# Patient Record
Sex: Male | Born: 1970 | Hispanic: Yes | Marital: Married | Smoking: Never smoker
Health system: Southern US, Community
[De-identification: ages and names within clinical notes are randomized; demographics above are authoritative.]

## PROBLEM LIST (undated history)

## (undated) DIAGNOSIS — M5440 Lumbago with sciatica, unspecified side: Secondary | ICD-10-CM

## (undated) DIAGNOSIS — F1190 Opioid use, unspecified, uncomplicated: Secondary | ICD-10-CM

## (undated) HISTORY — DX: Opioid use, unspecified, uncomplicated: F11.90

## (undated) HISTORY — DX: Lumbago with sciatica, unspecified side: M54.40

---

## 2012-05-06 ENCOUNTER — Emergency Department: Payer: Self-pay | Admitting: Emergency Medicine

## 2012-05-13 ENCOUNTER — Emergency Department: Payer: Self-pay | Admitting: Emergency Medicine

## 2012-06-14 ENCOUNTER — Encounter: Payer: Self-pay | Admitting: Orthopedic Surgery

## 2012-07-09 ENCOUNTER — Ambulatory Visit: Payer: Self-pay | Admitting: Orthopedic Surgery

## 2012-07-15 ENCOUNTER — Encounter: Payer: Self-pay | Admitting: Orthopedic Surgery

## 2014-01-20 ENCOUNTER — Emergency Department: Payer: Self-pay | Admitting: Emergency Medicine

## 2014-01-29 ENCOUNTER — Emergency Department: Payer: Self-pay | Admitting: Emergency Medicine

## 2014-03-07 IMAGING — CR DG KNEE COMPLETE 4+V*L*
1 series · 4 of 4 positions shown · non-contrast
Comparison: none

REASON FOR EXAM: left knee pain with ambulation
COMMENTS:

PROCEDURE:     DXR - DXR KNEE LT COMP WITH OBLIQUES  - May 13, 2012  [DATE]
RESULT:     There is no evidence of fracture, dislocation, or malalignment.

[Series 1: ap · 0.17mm/px · 4 of 4 slices shown]
[im 1/4]
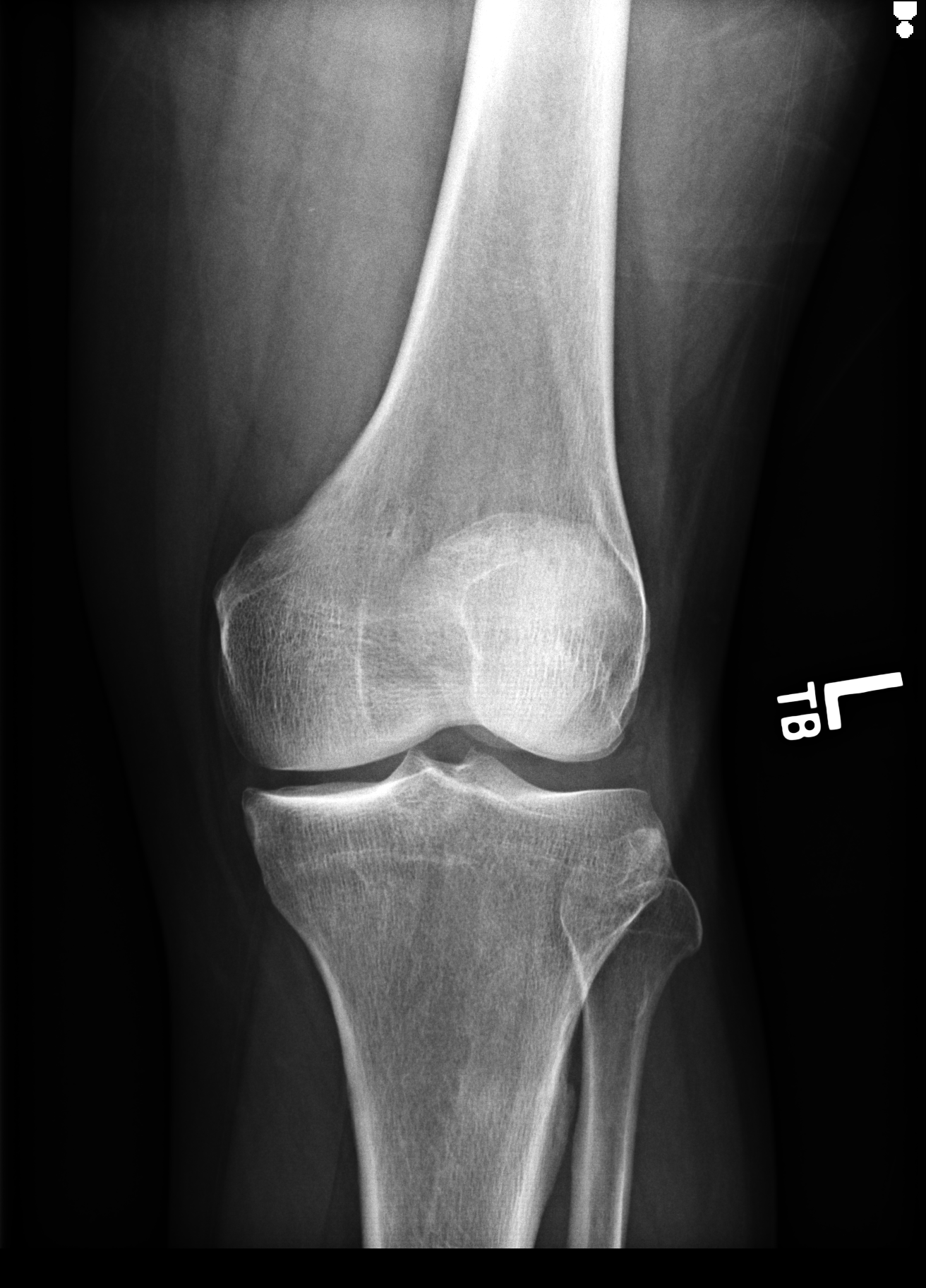
[im 2/4]
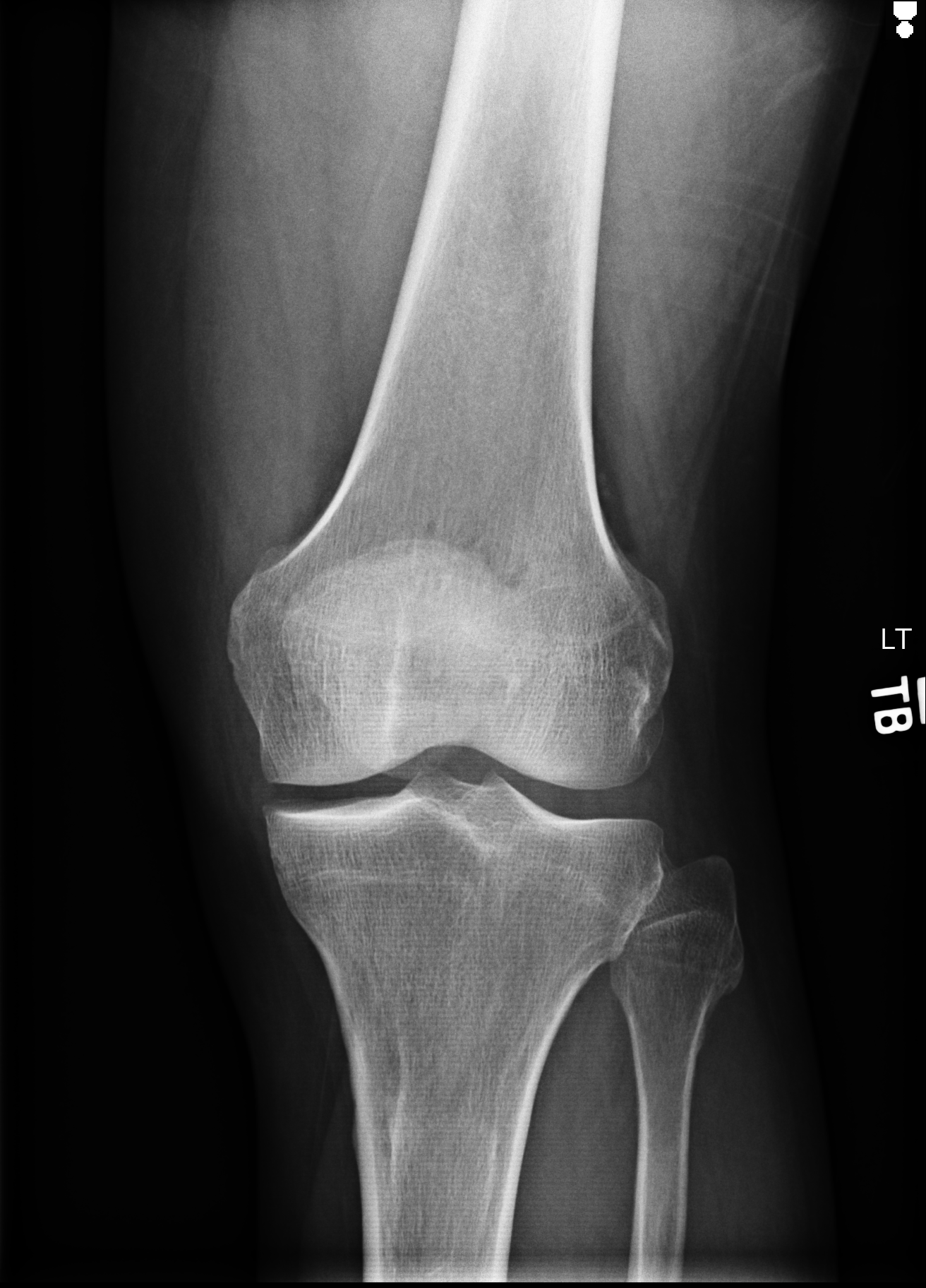
[im 3/4]
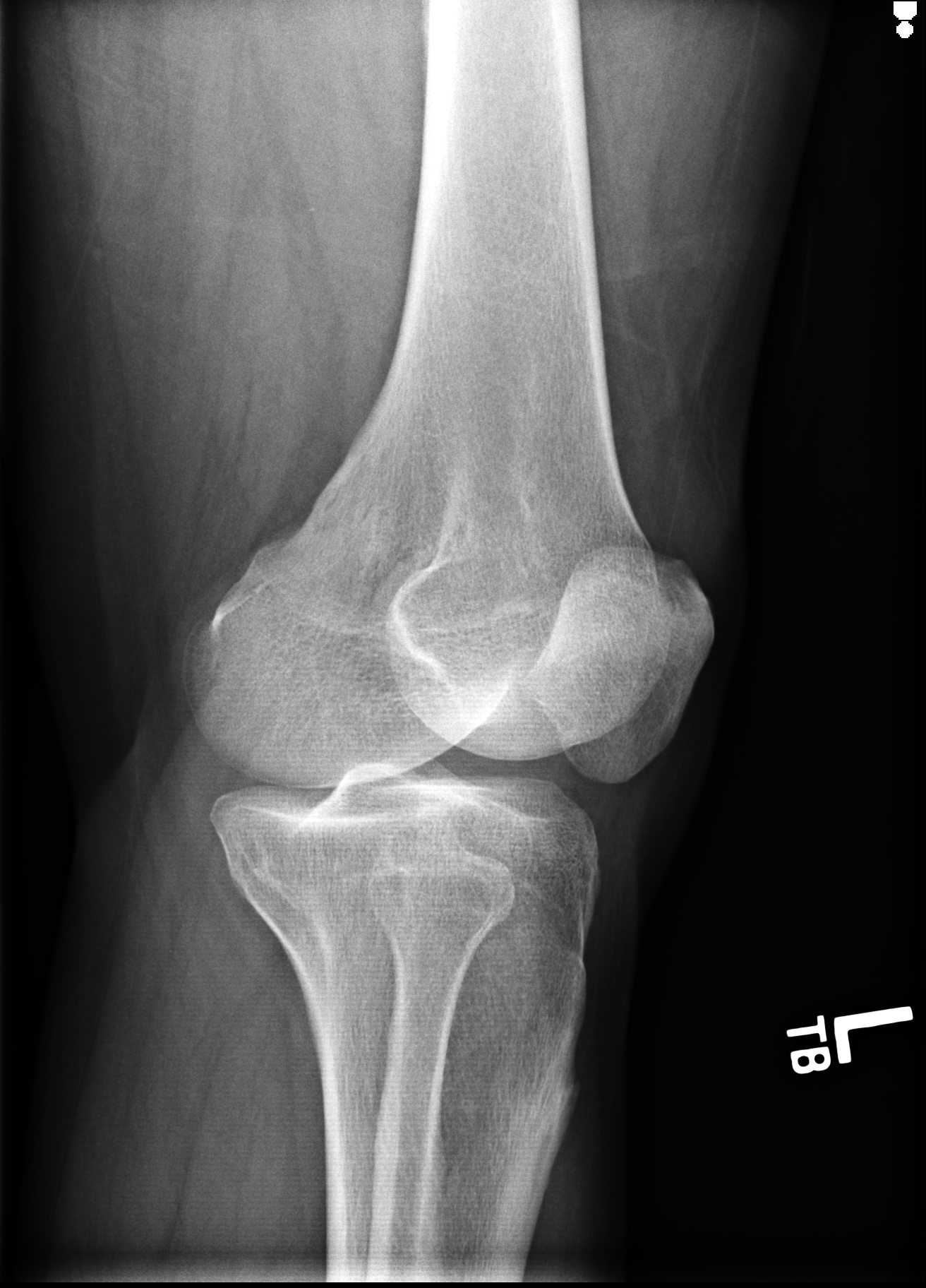
[im 4/4]
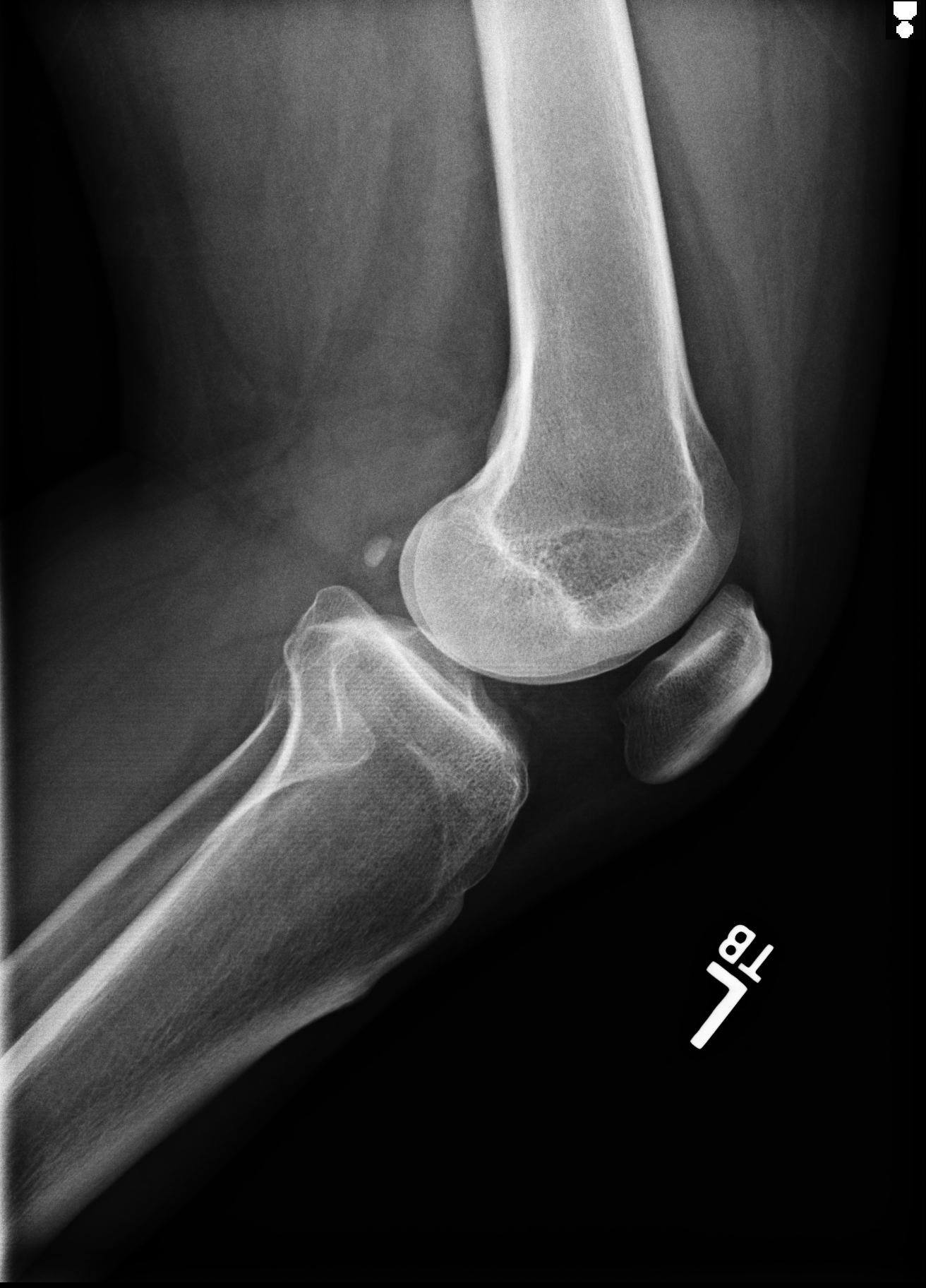

[4 of 4 positions shown; findings below may reference images not displayed]

IMPRESSION: 1. No evidence of acute abnormalities.
2. If there are persistent complaints of pain or persistent clinical
concern, a repeat evaluation in 7-10 days is recommended if clinically
warranted.

## 2014-10-15 ENCOUNTER — Emergency Department: Payer: Self-pay | Admitting: Emergency Medicine

## 2014-10-15 LAB — URINALYSIS, COMPLETE
BACTERIA: NONE SEEN
Bilirubin,UR: NEGATIVE
Blood: NEGATIVE
Glucose,UR: >=500 mg/dL (ref 0–75)
Leukocyte Esterase: NEGATIVE
Nitrite: NEGATIVE
Ph: 5 (ref 4.5–8.0)
Protein: NEGATIVE
SPECIFIC GRAVITY: 1.038 (ref 1.003–1.030)
Squamous Epithelial: NONE SEEN
WBC UR: <1 /HPF (ref 0–5)

## 2015-07-18 ENCOUNTER — Ambulatory Visit: Payer: Self-pay | Attending: Physical Medicine and Rehabilitation | Admitting: Physical Therapy

## 2015-07-18 DIAGNOSIS — M5442 Lumbago with sciatica, left side: Secondary | ICD-10-CM | POA: Insufficient documentation

## 2015-07-18 DIAGNOSIS — M5432 Sciatica, left side: Secondary | ICD-10-CM | POA: Insufficient documentation

## 2015-07-18 DIAGNOSIS — G8929 Other chronic pain: Secondary | ICD-10-CM | POA: Insufficient documentation

## 2015-07-18 NOTE — Patient Instructions (Signed)
All exercises provided were adapted from [email or web address]. Patient was provided a written handout with pictures as described. Any additional cues were manually entered in to handout and copied in to this document. (All exercises x 10 repetitions x 3 times per day)   HAMSTRING STRETCH - SUPINE  Mientras est acostado boca arriba, levante la pierna y sostenga la parte posterior de la rodilla hasta que se sienta un estiramiento.  Estiramiento Piriformis (Piriformis stretch)  Flexione la cadera hacia el cuerpo y tire del cuerpo hacia el lado opuesto.  Hilo dental del nervio citico (sciatic nerve flossing)  -Sit con las manos detrs de la espalda Puntear hacia abajo, hacia abajo Puntear los dedos hacia arriba -alternacin entre posiciones

## 2015-07-18 NOTE — Therapy (Signed)
[facility] MEDICINE [address] [place], [place], [address] Phone: [phone]   Fax:  [phone]  Physical Therapy Evaluation  Patient Details  Name: Steven Luna MRN: [id number] Date of Birth: 12/28/1970 No Data Recorded  Encounter Date: 07/18/2015      PT End of Session - 07/18/15 0956    Visit Number 1   Number of Visits 9   Date for PT Re-Evaluation 08/22/15   PT Start Time 0845   PT Stop Time 0945   PT Time Calculation (min) 60 min   Activity Tolerance Patient tolerated treatment well   Behavior During Therapy [facility] for tasks assessed/performed      No past medical history on file.  No past surgical history on file.  There were no vitals filed for this visit.  Visit Diagnosis:  Chronic left-sided low back pain with left-sided sciatica - Plan: PT plan of care cert/re-cert  Sciatica associated with disorder of lumbar spine, left - Plan: PT plan of care cert/re-cert      Subjective Assessment - 07/18/15 1444    Subjective Patient reports he began developing lower back pain that radiates down his L leg to his calf roughly February of 2015. He has not worked since then as a result of pain. Patient reports that he now has difficulty sleeping and performing ADLs as a result of these symptoms. Patient reports that sitting, standing in the shower, and ambulating have increased his symptoms.    Pertinent History Patient has had a similar experience several years ago, sought PT services and found symptoms resolved for 1.5 years.    Limitations Sitting;Lifting;Standing;Walking;House hold activities   Diagnostic tests Nonenoted.    Patient Stated Goals To reduce pain levels, similar to previous PT encounter.    Currently in Pain? Yes   Pain Score 7    Pain Location Back   Pain Orientation Left   Pain Type Chronic pain   Pain Onset More than a month ago   Pain Frequency Intermittent   Aggravating Factors  Moving  spine (flex/ext), physical activity, standing or sitting for any length of time.    Pain Relieving Factors Laying down   Effect of Pain on Daily Activities Is unable to work due to pain.             [facility] Assessment - 07/18/15 1015    Assessment   Medical Diagnosis --  Lumbar Radiculiitis   Prior Therapy --  Successful PT several years ago   Precautions   Precautions None   Restrictions   Weight Bearing Restrictions No   Balance Screen   Has the patient fallen in the past 6 months No   Has the patient had a decrease in activity level because of a fear of falling?  Yes   Home [employer]   Living Environment Private residence   Prior Function   Level of Independence Independent   Vocation --  Unemployed currently   Cognition   Overall Cognitive Status Within Functional Limits for tasks assessed   Observation/Other Assessments   Modified [place] 84   Fear Avoidance Belief Questionnaire (FABQ)  45   Sensation   Light Touch Not tested   Posture/Postural Control   Posture/Postural Control Postural limitations   Postural Limitations Weight shift right   Posture Comments --  Patient noted to be WB through toes only on L. R shoulder el   ROM / Strength   AROM / PROM / Strength Strength  Strength   Right Hip Flexion 5/5   Right Hip Extension 4+/5   Right Hip ABduction 4+/5   Left Hip Flexion 4+/5   Left Hip Extension 4+/5   Left Hip ABduction 4/5   Right Knee Flexion 5/5   Right Knee Extension 5/5   Left Knee Flexion 5/5   Left Knee Extension 4+/5   Right Ankle Dorsiflexion 5/5   Right Ankle Plantar Flexion 5/5   Right Ankle Inversion 5/5   Right Ankle Eversion 5/5   Left Ankle Dorsiflexion 5/5   Left Ankle Plantar Flexion 5/5   Left Ankle Inversion 5/5   Left Ankle Eversion 5/5   FABER test   findings Negative   Slump test   Findings Positive   Side Left   Comment --  Improved with nerve flossing   Straight Leg Raise   Findings Positive   Side  Left    Criag's Test    Findings --  Restricted IR/ER no increase in pain though   Ely's Test   Findings Positive   Side Left   Ambulation/Gait   Gait Pattern --  R weight shifting noted     90-90 testing revealed tightness and reproduction of symptoms on LLE   Piriformis stretching 2 sets x 2 minutes on LLE, stated this provided relief.   Nerve flossing on LLE, significantly reduced symptoms into his leg and patient described centralization of symptoms after. 2 sets x 12 repetitions with manual overpressure.   Bridging - significantly increased symptoms on 1st attempt   TFL substitution noted in hip abductor MMT.   Grade I-II mobilizations provided to PSIS on L side, states this relieved his pain/symptoms.   Long axis distraction bilaterally x 2 minutes, states this relieved symptoms   Patient left clinic stating centralized symptoms and his understanding of need to continue with exercises provided during this session.                       PT Education - 07/18/15 0954    Education provided Yes   Person(s) Educated Patient   Methods Explanation;Demonstration;Verbal cues;Handout;Tactile cues   Comprehension Verbalized understanding;Returned demonstration             PT Long Term Goals - 07/18/15 1450    PT LONG TERM GOAL #1   Title Patient will report VAS score of worst pain as less than 5/10 to demonstrate increased tolerance for ADLs.    Baseline 8/10   Time 4   Period Weeks   Status New   PT LONG TERM GOAL #2   Title Patient will report an ODI score of less than 70% disability to demonstrate increased tolerance for ADLs.    Baseline 84%   Time 4   Period Weeks   Status New   PT LONG TERM GOAL #3   Title Patient will report no increase in pain with turning in bed to allow for more comfortable sleep.   Time 4   Period Weeks   Status New   PT LONG TERM GOAL #4   Title Patient will be independent with a HEP to increase his tolerance for ADLs and  decrease his symptoms.    Time 4   Period Weeks               Plan - 07/18/15 1610    Clinical Impression Statement Patient is a 44 y/o with recurrent lower back  pain and left sided sciatica. Patient has previously had  this resolve x 1.5 years, however he has had this current bout x 2 years. Today he presents with numbness and tingling most pronounced around the lateral distal femur, which begins to centralize with piriformis stretching, nerve flossing, and UPAs over L PSIS. Pain reproduced over L PSIS, though UPAs. Gait demonstrated R sided weight shifting, both flexion and extension increased symptoms on this visit. Patient presentation seems consistent with laterally displaced disc material leading to sciatic nerve irritation. Patient responded well to treatment today, and has a previous history of successful bout of PT for this presentation.    Pt will benefit from skilled therapeutic intervention in order to improve on the following deficits Abnormal gait;Pain;Decreased mobility;Decreased strength;Impaired flexibility;Difficulty walking   Rehab Potential Good   Clinical Impairments Affecting Rehab Potential Pro- quick response, previous successful response, centralization in this session. Cons - long standing chronic pain. High FAB-Q score.    PT Frequency 2x / week   PT Duration 2 weeks  Then 1x per week, patient is self pay   PT Treatment/Interventions Traction;Moist Heat;Iontophoresis /ml Dexamethasone;Electrical Stimulation;Gait training;Biofeedback;Patient/family education;Manual techniques;Therapeutic exercise;Therapeutic activities;Dry needling;Taping   PT Next Visit Plan Progress HEP and manual stretching techniques. Check leg length. Weight shifting corrections.    PT Home Exercise Plan See patient instructions   Consulted and Agree with Plan of Care Patient         Problem List There are no active problems to display for this patient.  Kerin Ransom, PT, DPT     07/18/2015, 3:41 PM  [place] [facility] PHYSICAL AND SPORTS MEDICINE [address], [place], [address] Phone: [phone]   Fax:  [phone]  Name: Steven Luna MRN: [id number] Date of Birth: 06-17-1971

## 2015-07-22 ENCOUNTER — Ambulatory Visit: Payer: Self-pay | Admitting: Physical Therapy

## 2015-07-22 DIAGNOSIS — M5432 Sciatica, left side: Secondary | ICD-10-CM

## 2015-07-22 NOTE — Therapy (Signed)
[place] [facility]YSICAL [facility]ORTS MEDICINE [address] [place], [place], [address] Phone: [phone]   Fax:  [phone]  Physical Therapy Treatment  Patient Details  Name: Steven Luna MRN: [id number] Date of Birth: 01/09/1971 No Data Recorded  Encounter Date: 07/22/2015      PT End of Session - 07/22/15 0953    Visit Number 2   Number of Visits 9   Date for PT Re-Evaluation 08/22/15   PT Start Time 0945   PT Stop Time 1023   PT Time Calculation (min) 38 min   Activity Tolerance Patient tolerated treatment well   Behavior During Therapy [facility] for tasks assessed/performed      No past medical history on file.  No past surgical history on file.  There were no vitals filed for this visit.  Visit Diagnosis:  Sciatica associated with disorder of lumbar spine, left      Subjective Assessment - 07/22/15 0950    Subjective Pt reports he is doing better. He has been doing his stretches.   Pertinent History Patient has had a similar experience several years ago, sought PT services and found symptoms resolved for 1.5 years.    Limitations Sitting;Lifting;Standing;Walking;House hold activities   Diagnostic tests Nonenoted.    Patient Stated Goals To reduce pain levels, similar to previous PT encounter.    Currently in Pain? Yes   Pain Score 5    Pain Location Back   Pain Onset More than a month ago        Objective: Reviewed stretches and had pt perform exercises with no corrections needed.  Supine manual traction 5x1 min - reduction in pain to 2/10.  Press up in prone focusing on addressing extension - pt reported incr. Symptoms. Added manual overpressure at L1 which reduced syptoms to 0/10. Performed 3x10 of this.  Manual piriformis stretch 3x1 min on L.  Neural glide performed - pt reported incr. Pain. Upon standing noted significant lateral sihft.  Focus of rest of session on resolving this with manual shift  correction.  Performed manual shift correction - added extension 3x10. Pt reported pain centralized with this so issued self manual shift correction to be performed at home. Translator present for session.                         PT Education - 07/22/15 0953    Education provided Yes   Education Details HEP, progression of PT   Person(s) Educated Patient   Methods Explanation;Demonstration   Comprehension Verbalized understanding             PT Long Term Goals - 07/18/15 1450    PT LONG TERM GOAL #1   Title Patient will report VAS score of worst pain as less than 5/10 to demonstrate increased tolerance for ADLs.    Baseline 8/10   Time 4   Period Weeks   Status New   PT LONG TERM GOAL #2   Title Patient will report an ODI score of less than 70% disability to demonstrate increased tolerance for ADLs.    Baseline 84%   Time 4   Period Weeks   Status New   PT LONG TERM GOAL #3   Title Patient will report no increase in pain with turning in bed to allow for more comfortable sleep.   Time 4   Period Weeks   Status New   PT LONG TERM GOAL #4   Title Patient  will be independent with a HEP to increase his tolerance for ADLs and decrease his symptoms.    Time 4   Period Weeks               Plan - 07/22/15 1030    Clinical Impression Statement Pt appears to have primary problem of lateral shift. Would benefit from change in focus from slump/nerve flossing to addressing extension preference/lateral shift correction. Pt would also benefit from further traction to address pain.   Pt will benefit from skilled therapeutic intervention in order to improve on the following deficits Abnormal gait;Pain;Decreased mobility;Decreased strength;Impaired flexibility;Difficulty walking   Rehab Potential Good   Clinical Impairments Affecting Rehab Potential Pro- quick response, previous successful response, centralization in this session. Cons - long standing chronic  pain. High FAB-Q score.    PT Frequency 2x / week   PT Duration 2 weeks  Then 1x per week, patient is self pay   PT Treatment/Interventions Traction;Moist Heat;Iontophoresis /ml Dexamethasone;Electrical Stimulation;Gait training;Biofeedback;Patient/family education;Manual techniques;Therapeutic exercise;Therapeutic activities;Dry needling;Taping   PT Next Visit Plan Progress HEP and manual stretching techniques. Check leg length. Weight shifting corrections.    PT Home Exercise Plan See patient instructions   Consulted and Agree with Plan of Care Patient        Problem List There are no active problems to display for this patient.   Fisher,Benjamin PT 07/22/2015, 11:49 AM  [place] [facility] PHYSICAL AND SPORTS MEDICINE [address], [place], [address] Phone: [phone]   Fax:  [phone]  Name: Steven Luna MRN: [id number] Date of Birth: 07-30-1971

## 2015-07-25 ENCOUNTER — Ambulatory Visit: Payer: Self-pay | Admitting: Physical Therapy

## 2015-07-25 DIAGNOSIS — M5442 Lumbago with sciatica, left side: Secondary | ICD-10-CM

## 2015-07-25 DIAGNOSIS — M5432 Sciatica, left side: Secondary | ICD-10-CM

## 2015-07-25 DIAGNOSIS — G8929 Other chronic pain: Secondary | ICD-10-CM

## 2015-07-25 NOTE — Therapy (Signed)
[place] [facility]TS [facility] [address], [place], [address] Phone: [phone]   Fax:  [phone]  Physical Therapy Treatment  Patient Details  Name: Steven Luna MRN: [id number] Date of Birth: Jan 16, 1971 No Data Recorded  Encounter Date: 07/25/2015      PT End of Session - 07/25/15 1343    Visit Number 3   Number of Visits 9   Date for PT Re-Evaluation 08/22/15   PT Start Time 1300   PT Stop Time 1340   PT Time Calculation (min) 40 min   Activity Tolerance Patient tolerated treatment well;No increased pain   Behavior During Therapy [facility] for tasks assessed/performed      No past medical history on file.  No past surgical history on file.  There were no vitals filed for this visit.  Visit Diagnosis:  Sciatica associated with disorder of lumbar spine, left  Chronic left-sided low back pain with left-sided sciatica      Subjective Assessment - 07/25/15 1340    Subjective Patient reports he is getting better with each visit. Reports exercises only help a little bit. initially patient reports 6/10 pain, at end of session pt reporting 3/10.   Limitations Sitting;Lifting;Standing;Walking;House hold activities   Patient Stated Goals To reduce pain levels, similar to previous PT encounter.    Currently in Pain? Yes   Pain Score 6    Pain Location Leg   Pain Orientation Left   Pain Descriptors / Indicators Tingling;Radiating   Pain Type Chronic pain   Pain Onset More than a month ago   Pain Frequency Constant   Multiple Pain Sites No      Lumbar rotations x 3 min for warm up  Grade I-II mobilizations provided to PSIS on L side, states this relieved his pain/symptoms.   Long axis distraction bilaterally x 10 minutes, states this relieved symptoms   Left lateral shift x10 while standing at wall improved symptoms, right lateral shift increased pain.   Patient left clinic stating centralized symptoms and his  understanding of need to continue with exercises provided during this session.           PT Education - 07/25/15 1342    Education provided Yes   Education Details progression of PT, looking into Mechanical Traction. Explained Dry Needling and answered questions.    Person(s) Educated Patient   Methods Explanation;Demonstration   Comprehension Verbalized understanding             PT Long Term Goals - 07/18/15 1450    PT LONG TERM GOAL #1   Title Patient will report VAS score of worst pain as less than 5/10 to demonstrate increased tolerance for ADLs.    Baseline 8/10   Time 4   Period Weeks   Status New   PT LONG TERM GOAL #2   Title Patient will report an ODI score of less than 70% disability to demonstrate increased tolerance for ADLs.    Baseline 84%   Time 4   Period Weeks   Status New   PT LONG TERM GOAL #3   Title Patient will report no increase in pain with turning in bed to allow for more comfortable sleep.   Time 4   Period Weeks   Status New   PT LONG TERM GOAL #4   Title Patient will be independent with a HEP to increase his tolerance for ADLs and decrease his symptoms.    Time 4   Period  Weeks               Plan - 07/25/15 1343    Clinical Impression Statement Patient is making good progress with reported pain this session. Traction and lateral shift to his left decrease his pain the most.    Pt will benefit from skilled therapeutic intervention in order to improve on the following deficits Abnormal gait;Pain;Decreased mobility;Decreased strength;Impaired flexibility;Difficulty walking   Rehab Potential Good   Clinical Impairments Affecting Rehab Potential Pro- quick response, previous successful response, centralization in this session. Cons - long standing chronic pain. High FAB-Q score.    PT Frequency 2x / week   PT Duration 2 weeks   PT Treatment/Interventions Traction;Moist Heat;Iontophoresis 4mg /ml Dexamethasone;Electrical  Stimulation;Gait training;Biofeedback;Patient/family education;Manual techniques;Therapeutic exercise;Therapeutic activities;Dry needling;Taping   PT Next Visit Plan Progress HEP and manual stretching techniques. Check leg length. Weight shifting corrections.    PT Home Exercise Plan See patient instructions   Consulted and Agree with Plan of Care Patient        Problem List There are no active problems to display for this patient.   Ursala Cressy, PT, MPT, GCS 07/25/2015, 1:46 PM  [place] [facility] PHYS[facility]ICINE [address] [place], [place], [address] Phone: [phone]   Fax:  [phone]  Name: Steven Luna MRN: [id number] Date of Birth: 12/19/1970

## 2015-07-29 ENCOUNTER — Ambulatory Visit: Payer: Self-pay | Admitting: Physical Therapy

## 2015-07-29 DIAGNOSIS — G8929 Other chronic pain: Secondary | ICD-10-CM

## 2015-07-29 DIAGNOSIS — M5442 Lumbago with sciatica, left side: Secondary | ICD-10-CM

## 2015-07-29 DIAGNOSIS — M5432 Sciatica, left side: Secondary | ICD-10-CM

## 2015-07-29 NOTE — Therapy (Signed)
[place] [facility] [address] [place], [address] Phone: [phone]   Fax:  [phone]  Physical Therapy Treatment  Patient Details  Name: Steven Luna MRN: [id number] Date of Birth: 12-Feb-1971 No Data Recorded  Encounter Date: 07/29/2015      PT End of Session - 07/29/15 1014    Visit Number 4   Number of Visits 9   Date for PT Re-Evaluation 08/22/15   PT Start Time 0900   PT Stop Time 0945   PT Time Calculation (min) 45 min   Activity Tolerance Patient tolerated treatment well;No increased pain   Behavior During Therapy [facility] for tasks assessed/performed      No past medical history on file.  No past surgical history on file.  There were no vitals filed for this visit.  Visit Diagnosis:  Sciatica associated with disorder of lumbar spine, left  Chronic left-sided low back pain with left-sided sciatica      Subjective Assessment - 07/29/15 0904    Subjective Patient reports he is improving with his symptoms and very diligent with his exercises provided by PT. He enters with R lateral shift again in this session.     Pertinent History Patient has had a similar experience several years ago, sought PT services and found symptoms resolved for 1.5 years.    Limitations Sitting;Lifting;Standing;Walking;House hold activities   Diagnostic tests Nonenoted.    Patient Stated Goals To reduce pain levels, similar to previous PT encounter.    Currently in Pain? Yes   Pain Score 5    Pain Location Calf   Pain Orientation Left   Pain Descriptors / Indicators Tingling;Radiating;Numbness   Pain Type Chronic pain   Pain Onset More than a month ago   Pain Frequency Intermittent   Pain Relieving Factors Laying down          Manual L side glides in standing 2 bouts x 30 seconds (reproduced his LBP and distal L LE symptoms, reduced with repetitions)   R sided laying x 1 minute to provide L side glide (no  real change in symptoms, educated patient to lay on his side when possible to provide L side glide)   Lumbar rotations with LLE elevated in sidelying Grade IV, decreased symptoms in lumbar spine, noted some increase in LLE x 1 minute bout  Slump testing N flossing x 10 with manual overpressure (well tolerated and reduced symptoms) 2 sets   Unilateral PAs to L transverse process L4/L5 grade II, no increase in pain at 2, increase with grade 3. 2 30 second bouts  Patient inquired about mechanical lumbar traction, performed long axis distraction x 3 bouts of 30 seconds.   Standing calf stretching x 10 repetitions for 5" holds with verbal and manual cuing for technique. Improved lateral shifting noted in ambulation, patient reported significantly decreased symptoms after. Completed 2nd set with maintenance of symptom resolution (still small pain in calf)  Unable to complete sciatic N flossing secondary to pain/noted weakness  Manual stretching into PF and DF x 30 seconds for 2 bouts each with decreased symptoms afterwards noted.   Standing L side glides (R shoulder on wall) x 30" bouts x 2 sets, reproduction of pain initially, decreased with repetition.   **Noted patient demonstrated less shifting during ambulation with longer strides after tx than before. Patient reports much less pain after session than before session.  PT Education - 07/29/15 1013    Education provided Yes   Education Details progression of HEP, mechanical traction in next session per his request.   Person(s) Educated Patient   Methods Explanation;Demonstration;Handout;Tactile cues;Verbal cues   Comprehension Verbalized understanding;Returned demonstration;Need further instruction             PT Long Term Goals - 07/18/15 1450    PT LONG TERM GOAL #1   Title Patient will report VAS score of worst pain as less than 5/10 to demonstrate increased tolerance for ADLs.     Baseline 8/10   Time 4   Period Weeks   Status New   PT LONG TERM GOAL #2   Title Patient will report an ODI score of less than 70% disability to demonstrate increased tolerance for ADLs.    Baseline 84%   Time 4   Period Weeks   Status New   PT LONG TERM GOAL #3   Title Patient will report no increase in pain with turning in bed to allow for more comfortable sleep.   Time 4   Period Weeks   Status New   PT LONG TERM GOAL #4   Title Patient will be independent with a HEP to increase his tolerance for ADLs and decrease his symptoms.    Time 4   Period Weeks               Plan - 07/29/15 1014    Clinical Impression Statement Patient continues to report decreased pain after sessions, though R lateral shift is noted. This reduces with L calf stretching and L side glides of pelvis in standing. Encouraged patient to begin R sided laying as well. Patient reporting mild pain in calf today, back pain is no longer present.    Pt will benefit from skilled therapeutic intervention in order to improve on the following deficits Abnormal gait;Pain;Decreased mobility;Decreased strength;Impaired flexibility;Difficulty walking   Rehab Potential Good   Clinical Impairments Affecting Rehab Potential Pro- quick response, previous successful response, centralization in this session. Cons - long standing chronic pain. High FAB-Q score.    PT Frequency 2x / week   PT Duration 2 weeks   PT Treatment/Interventions Traction;Moist Heat;Iontophoresis 4mg /ml Dexamethasone;Electrical Stimulation;Gait training;Biofeedback;Patient/family education;Manual techniques;Therapeutic exercise;Therapeutic activities;Dry needling;Taping   PT Next Visit Plan Progression of HEP and manual tehcniques.    PT Home Exercise Plan See patient instructions   Consulted and Agree with Plan of Care Patient        Problem List There are no active problems to display for this patient.  Kerin RansomPatrick A McNamara, PT, DPT     07/29/2015, 10:54 AM  [place] [facility] [address] [place], [place], [address] Phone: [phone]   Fax:  [phone]  Name: Steven Luna MRN: [id number] Date of Birth: 01/18/1971

## 2015-07-29 NOTE — Patient Instructions (Addendum)
   All exercises provided were adapted from [email or web address].com. Patient was provided a written handout with pictures as described. Any additional cues were manually entered in to handout and copied in to this document.  Side glides in standing with appropriate translation. Standing calf stretching with appropriate translation.

## 2015-08-01 ENCOUNTER — Ambulatory Visit: Payer: Self-pay | Admitting: Physical Therapy

## 2015-08-01 DIAGNOSIS — M5432 Sciatica, left side: Secondary | ICD-10-CM

## 2015-08-01 NOTE — Therapy (Signed)
[place] [facility] [address], [place], [address] Phone: [phone]   Fax:  [phone]  Physical Therapy Treatment  Patient Details  Name: Steven Luna MRN: [id number] Date of Birth: 1970/11/01 No Data Recorded  Encounter Date: 08/01/2015      PT End of Session - 08/01/15 1412    Visit Number 5   Number of Visits 9   Date for PT Re-Evaluation 08/22/15   PT Start Time 1345   PT Stop Time 1414   PT Time Calculation (min) 29 min   Activity Tolerance Patient tolerated treatment well;No increased pain   Behavior During Therapy [facility] for tasks assessed/performed      No past medical history on file.  No past surgical history on file.  There were no vitals filed for this visit.  Visit Diagnosis:  Sciatica associated with disorder of lumbar spine, left      Subjective Assessment - 08/01/15 1344    Subjective Overall pt reports he is "a little bit better". Able to walk and sleep better but is still continuing to have pain.   Pertinent History Patient has had a similar experience several years ago, sought PT services and found symptoms resolved for 1.5 years.    Limitations Sitting;Lifting;Standing;Walking;House hold activities   Diagnostic tests Nonenoted.    Patient Stated Goals To reduce pain levels, similar to previous PT encounter.    Currently in Pain? Yes   Pain Score 5    Pain Location Calf   Pain Orientation Left   Pain Onset More than a month ago           Objective: Reviewed Standing lateral hip glide - pt unable to perform well so discontinued.  Reviewed slump stretch - incr. Pain so discontinued.  Had pt lay in prone with manual shift of hips to L. Pt laid in this position for 8 min, reduced pain to 4/10.  Several times had to recorrect hips as pt shifted away from corrected position.  Prone press ups in this position 3x10.  Same performed with manual overpressure.  Extensive  education on avoiding flexion, focused prone extension with self correction of posture, issued as HEP to be performed every hour or two. Translator present for session.                      PT Education - 08/01/15 1347    Education provided Yes   Education Details HEP   Person(s) Educated Patient   Methods Explanation   Comprehension Verbalized understanding             PT Long Term Goals - 07/18/15 1450    PT LONG TERM GOAL #1   Title Patient will report VAS score of worst pain as less than 5/10 to demonstrate increased tolerance for ADLs.    Baseline 8/10   Time 4   Period Weeks   Status New   PT LONG TERM GOAL #2   Title Patient will report an ODI score of less than 70% disability to demonstrate increased tolerance for ADLs.    Baseline 84%   Time 4   Period Weeks   Status New   PT LONG TERM GOAL #3   Title Patient will report no increase in pain with turning in bed to allow for more comfortable sleep.   Time 4   Period Weeks   Status New   PT LONG TERM GOAL #4   Title Patient  will be independent with a HEP to increase his tolerance for ADLs and decrease his symptoms.    Time 4   Period Weeks               Plan - 08/01/15 1414    Clinical Impression Statement Pt made significant in session improvement ilaying prone with manually assisted lateral shift - pain improved from 5 to 3 today. Limited session due to pt responding well but still in a highly irritable state.   Pt will benefit from skilled therapeutic intervention in order to improve on the following deficits Abnormal gait;Pain;Decreased mobility;Decreased strength;Impaired flexibility;Difficulty walking   Rehab Potential Good   Clinical Impairments Affecting Rehab Potential Pro- quick response, previous successful response, centralization in this session. Cons - long standing chronic pain. High FAB-Q score.    PT Frequency 2x / week   PT Duration 2 weeks   PT Treatment/Interventions  Traction;Moist Heat;Iontophoresis 4mg /ml Dexamethasone;Electrical Stimulation;Gait training;Biofeedback;Patient/family education;Manual techniques;Therapeutic exercise;Therapeutic activities;Dry needling;Taping   PT Next Visit Plan Progression of HEP and manual tehcniques.    PT Home Exercise Plan See patient instructions   Consulted and Agree with Plan of Care Patient        Problem List There are no active problems to display for this patient.   Fisher,Benjamin PT 08/01/2015, 2:20 PM  [place] [facility] [address] [place], [place], [address] Phone: [phone]   Fax:  [phone]  Name: Steven Luna MRN: [id number] Date of Birth: 12/13/1970

## 2015-08-05 ENCOUNTER — Ambulatory Visit: Payer: Self-pay | Admitting: Physical Therapy

## 2015-08-05 DIAGNOSIS — M5432 Sciatica, left side: Secondary | ICD-10-CM

## 2015-08-05 NOTE — Therapy (Signed)
[place] [facility] [address], [place], [address] Phone: [phone]   Fax:  [phone]  Physical Therapy Treatment  Patient Details  Name: Steven Luna MRN: [id number] Date of Birth: Jan 23, 1971 No Data Recorded  Encounter Date: 08/05/2015      PT End of Session - 08/05/15 1430    Visit Number 6   Number of Visits 9   Date for PT Re-Evaluation 08/22/15   PT Start Time 1316   PT Stop Time 1342   PT Time Calculation (min) 26 min   Activity Tolerance Patient tolerated treatment well;No increased pain   Behavior During Therapy [facility] for tasks assessed/performed      No past medical history on file.  No past surgical history on file.  There were no vitals filed for this visit.  Visit Diagnosis:  Sciatica associated with disorder of lumbar spine, left      Subjective Assessment - 08/05/15 1316    Subjective Pt reports he is doing significantly better with pain today, he is very pleased with his response to PT so far. Interpreter 18 min late so short session.   Pertinent History Patient has had a similar experience several years ago, sought PT services and found symptoms resolved for 1.5 years.    Limitations Sitting;Lifting;Standing;Walking;House hold activities   Diagnostic tests Nonenoted.    Patient Stated Goals To reduce pain levels, similar to previous PT encounter.    Currently in Pain? Yes   Pain Score 3    Pain Location Calf   Pain Orientation Left   Pain Onset More than a month ago          Objective: Supine pillow under knees, distraction oscillations grade III 5x1 min. Following this pt reported 2/10 pain in calf.  Added in shift correction performd two additional grade IIIs with no change in pain.  Prone shift correction with press ups 3x10.  Prone press up position hold with CPAs grade III 3x1 min L2-L5.  Following session pt reported full resolution of pain, however pt did  continue to demonstrate signfiicant lateral shift.                       PT Education - 08/05/15 1429    Education provided Yes   Education Details Reinforcement of postural exercises.   Person(s) Educated Patient   Methods Explanation   Comprehension Verbalized understanding             PT Long Term Goals - 07/18/15 1450    PT LONG TERM GOAL #1   Title Patient will report VAS score of worst pain as less than 5/10 to demonstrate increased tolerance for ADLs.    Baseline 8/10   Time 4   Period Weeks   Status New   PT LONG TERM GOAL #2   Title Patient will report an ODI score of less than 70% disability to demonstrate increased tolerance for ADLs.    Baseline 84%   Time 4   Period Weeks   Status New   PT LONG TERM GOAL #3   Title Patient will report no increase in pain with turning in bed to allow for more comfortable sleep.   Time 4   Period Weeks   Status New   PT LONG TERM GOAL #4   Title Patient will be independent with a HEP to increase his tolerance for ADLs and decrease his symptoms.    Time 4  Period Weeks               Plan - 08/05/15 1431    Clinical Impression Statement At the end of session pt reported complete resolution of pain at end of session - continues to have some postural shift in standing.   Pt will benefit from skilled therapeutic intervention in order to improve on the following deficits Abnormal gait;Pain;Decreased mobility;Decreased strength;Impaired flexibility;Difficulty walking   Rehab Potential Good   Clinical Impairments Affecting Rehab Potential Pro- quick response, previous successful response, centralization in this session. Cons - long standing chronic pain. High FAB-Q score.    PT Frequency 2x / week   PT Duration 2 weeks   PT Treatment/Interventions Traction;Moist Heat;Iontophoresis 4mg /ml Dexamethasone;Electrical Stimulation;Gait training;Biofeedback;Patient/family education;Manual techniques;Therapeutic  exercise;Therapeutic activities;Dry needling;Taping   PT Next Visit Plan Progression of HEP and manual tehcniques.    PT Home Exercise Plan See patient instructions   Consulted and Agree with Plan of Care Patient        Problem List There are no active problems to display for this patient.   Tighe Gitto PT 08/05/2015, 2:35 PM  [place] [facility] [address] [place], [place], [address] Phone: [phone]   Fax:  [phone]  Name: Steven Luna MRN: [id number] Date of Birth: 02/08/1971

## 2015-08-12 ENCOUNTER — Ambulatory Visit: Payer: Self-pay | Admitting: Physical Therapy

## 2015-08-12 DIAGNOSIS — M5432 Sciatica, left side: Secondary | ICD-10-CM

## 2015-08-12 NOTE — Therapy (Signed)
[place] [facility] [address] [place], [place], [address] Phone: [phone]   Fax:  [phone]  Physical Therapy Treatment  Patient Details  Name: Steven Luna MRN: [id number] Date of Birth: 05/04/1971 No Data Recorded  Encounter Date: 08/12/2015      PT End of Session - 08/12/15 0836    Visit Number 7   Number of Visits 9   Date for PT Re-Evaluation 08/22/15   PT Start Time 0825   PT Stop Time 0905   PT Time Calculation (min) 40 min   Activity Tolerance Patient tolerated treatment well;No increased pain   Behavior [facility] Therapy [facility] for tasks assessed/performed      No past medical history on file.  No past surgical history on file.  There were no vitals filed for this visit.  Visit Diagnosis:  Sciatica associated with disorder of lumbar spine, left      Subjective Assessment - 08/12/15 0833    Subjective Pt repots pain is now nearly gone. He still has the shift in his hips which is a concern. Walking is "a little painful".   Pertinent History Patient has had a similar experience several years ago, sought PT services and found symptoms resolved for 1.5 years.    Limitations Sitting;Lifting;Standing;Walking;House hold activities   Diagnostic tests Nonenoted.    Patient Stated Goals To reduce pain levels, similar to previous PT encounter.    Currently in Pain? No/denies   Pain Score 0-No pain   Pain Onset More than a month ago          Objective: Manual traction performed on B LE straight leg and bent leg 18 min total due to traction table not working. Pt reported complete resolution of pain and mild numbness in L LE.  Assessed reflexes - hyporeflexive on L but intact (1 vs 3).  L UPAs grade II 3x1 min L2-L5, CPAs same region with improvement in N/T following this.   Ended session with Prone extensions 3x10.                       PT Education - 08/12/15 0835    Education  provided Yes   Education Details self traction strategies using LE draping from bed.   Person(s) Educated Patient   Methods Explanation   Comprehension Verbalized understanding             PT Long Term Goals - 07/18/15 1450    PT LONG TERM GOAL #1   Title Patient will report VAS score of worst pain as less than 5/10 to demonstrate increased tolerance for ADLs.    Baseline 8/10   Time 4   Period Weeks   Status New   PT LONG TERM GOAL #2   Title Patient will report an ODI score of less than 70% disability to demonstrate increased tolerance for ADLs.    Baseline 84%   Time 4   Period Weeks   Status New   PT LONG TERM GOAL #3   Title Patient will report no increase in pain with turning in bed to allow for more comfortable sleep.   Time 4   Period Weeks   Status New   PT LONG TERM GOAL #4   Title Patient will be independent with a HEP to increase his tolerance for ADLs and decrease his symptoms.    Time 4   Period Weeks  Plan - 08/12/15 0907    Clinical Impression Statement Pt reports 0/10 pain. No N/T in L LE following session. Pt is still preoccupied with traction despite improvement so will focus on this at next session.    Pt will benefit from skilled therapeutic intervention in order to improve on the following deficits Abnormal gait;Pain;Decreased mobility;Decreased strength;Impaired flexibility;Difficulty walking   Rehab Potential Good   Clinical Impairments Affecting Rehab Potential Pro- quick response, previous successful response, centralization in this session. Cons - long standing chronic pain. High FAB-Q score.    PT Frequency 2x / week   PT Duration 2 weeks   PT Treatment/Interventions Traction;Moist Heat;Iontophoresis /ml Dexamethasone;Electrical Stimulation;Gait training;Biofeedback;Patient/family education;Manual techniques;Therapeutic exercise;Therapeutic activities;Dry needling;Taping   PT Next Visit Plan Progression of HEP and manual  tehcniques.    PT Home Exercise Plan See patient instructions   Consulted and Agree with Plan of Care Patient        Problem List There are no active problems to display for this patient.   Teria Khachatryan PT 08/12/2015, 9:48 AM  [place] [facility] PHYSICAL AND SPORTS MEDICINE [address] [place], [address] Phone: [phone]   Fax:  [phone]  Name: Angola Fortune MRN: [id number] Date of Birth: Sep 24, 1970

## 2015-08-15 ENCOUNTER — Ambulatory Visit: Payer: Self-pay | Attending: Physical Medicine and Rehabilitation | Admitting: Physical Therapy

## 2015-08-15 DIAGNOSIS — M5432 Sciatica, left side: Secondary | ICD-10-CM | POA: Insufficient documentation

## 2015-08-15 DIAGNOSIS — M5442 Lumbago with sciatica, left side: Secondary | ICD-10-CM | POA: Insufficient documentation

## 2015-08-15 DIAGNOSIS — G8929 Other chronic pain: Secondary | ICD-10-CM | POA: Insufficient documentation

## 2015-08-15 NOTE — Patient Instructions (Addendum)
   All exercises provided were adapted from [email or web address].com. Patient was provided a written handout with pictures as described. Any additional cues were manually entered in to handout and copied in to this document.  Supine bridge  Lie down on your back and bend your knees so that your feet are flat on the table. Press your heels into the ground to lift your hips off of the table. You should stabilize through your core. Return to the starting position controlling your speed.    HAMSTRING STRETCH - SUPINE  While lying on your back, raise up your leg and hold the back of your knee until a stretch is felt.

## 2015-08-16 NOTE — Therapy (Signed)
[facility] [address], [place], [address] Phone: [phone]   Fax:  [phone]  Physical Therapy Treatment  Patient Details  Name: Steven Luna MRN: [id number] Date of Birth: 11/14/1970 No Data Recorded  Encounter Date: 08/15/2015      PT End of Session - 08/15/15 1630    Visit Number 8   Number of Visits 9   Date for PT Re-Evaluation 08/22/15   PT Start Time 1345   PT Stop Time 1435   PT Time Calculation (min) 50 min   Activity Tolerance Patient tolerated treatment well;No increased pain   Behavior During Therapy [facility] for tasks assessed/performed      No past medical history on file.  No past surgical history on file.  There were no vitals filed for this visit.  Visit Diagnosis:  Sciatica associated with disorder of lumbar spine, left  Chronic left-sided low back pain with left-sided sciatica      Subjective Assessment - 08/15/15 1353    Subjective Patient reports that he continues to have pain around his waist that travels down to his calves, it decreases to 1/10 throughout the day. L>R.    Pertinent History Patient has had a similar experience several years ago, sought PT services and found symptoms resolved for 1.5 years.    Limitations Sitting;Lifting;Standing;Walking;House hold activities   Diagnostic tests Nonenoted.    Patient Stated Goals To reduce pain levels, similar to previous PT encounter.    Currently in Pain? No/denies        Piriformis stretch performed manually x 2 bouts for 2 minutes bilaterally with decreased symptoms noted afterwards   Rotational Mobilizations grade IV bilaterally at roughly L4-L5 juncture with significant resolution of symptoms in single leg stance on LLE. X 2 minute bouts for 2 repetitions bilaterally   UPAs on L5, found joint sign on transverse process with palpation and provided grade III mobilizations, no complaints afterwards.   Long axis  distraction x 3 bouts bilaterally for 1-2 minutes each with relief of symptoms afterwards.   Supine 90-90 hamstring stretching - x 10 repetitions for 5" holds with notable stretching sensation in RLE  SLR stretching on RLE x 10 for 5" holds, decreased remainder of symptoms in LLE.   Compared to comparable sign, standing on LLE with pain going down into RLE.                           PT Education - 08/16/15 1721    Education provided Yes   Education Details Indications for use of traction and increased HEP.    Person(s) Educated Patient   Methods Explanation;Demonstration;Handout   Comprehension Verbalized understanding;Returned demonstration             PT Long Term Goals - 07/18/15 1450    PT LONG TERM GOAL #1   Title Patient will report VAS score of worst pain as less than 5/10 to demonstrate increased tolerance for ADLs.    Baseline 8/10   Time 4   Period Weeks   Status New   PT LONG TERM GOAL #2   Title Patient will report an ODI score of less than 70% disability to demonstrate increased tolerance for ADLs.    Baseline 84%   Time 4   Period Weeks   Status New   PT LONG TERM GOAL #3   Title Patient will report no increase in pain with turning in  bed to allow for more comfortable sleep.   Time 4   Period Weeks   Status New   PT LONG TERM GOAL #4   Title Patient will be independent with a HEP to increase his tolerance for ADLs and decrease his symptoms.    Time 4   Period Weeks               Plan - 08/15/15 1630    Clinical Impression Statement Patient reports significantly less pain with LLE single leg stance after this session. He continues to request traction, though opted not to provide in this session as his symptoms improved significantly without it. Patient states he is now sleeping through the night and has pain for very short periods throughout the day.    Pt will benefit from skilled therapeutic intervention in order to improve  on the following deficits Abnormal gait;Pain;Decreased mobility;Decreased strength;Impaired flexibility;Difficulty walking   Rehab Potential Good   Clinical Impairments Affecting Rehab Potential Pro- quick response, previous successful response, centralization in this session. Cons - long standing chronic pain. High FAB-Q score.    PT Frequency 2x / week   PT Duration 2 weeks   PT Treatment/Interventions Traction;Moist Heat;Iontophoresis 4mg /ml Dexamethasone;Electrical Stimulation;Gait training;Biofeedback;Patient/family education;Manual techniques;Therapeutic exercise;Therapeutic activities;Dry needling;Taping   PT Next Visit Plan Progression of HEP and manual tehcniques. Re-assess patient.    PT Home Exercise Plan See patient instructions   Consulted and Agree with Plan of Care Patient        Problem List There are no active problems to display for this patient.  Kerin RansomPatrick A McNamara, PT, DPT    08/16/2015, 5:23 PM  [facility] PHYSICAL [facility] MEDICINE [address] [place], [place], [id number] Phone: [phone]   Fax:  [phone]  Name: AngolaIsrael Oplinger MRN: [id number] Date of Birth: 11/19/1970

## 2015-08-19 ENCOUNTER — Ambulatory Visit: Payer: Self-pay | Admitting: Physical Therapy

## 2015-08-19 DIAGNOSIS — M5432 Sciatica, left side: Secondary | ICD-10-CM

## 2015-08-19 NOTE — Therapy (Signed)
[facility] [address] [place], [address] Phone: [phone]   Fax:  [phone]  Physical Therapy Treatment  Patient Details  Name: Steven Luna MRN: [id number] Date of Birth: 06-28-1971 No Data Recorded  Encounter Date: 08/19/2015      PT End of Session - 08/19/15 1307    Visit Number 9   Number of Visits 18   Date for PT Re-Evaluation 08/22/15   PT Start Time 1300   PT Stop Time 1340   PT Time Calculation (min) 40 min   Activity Tolerance Patient tolerated treatment well;No increased pain   Behavior During Therapy [facility] for tasks assessed/performed      No past medical history on file.  No past surgical history on file.  There were no vitals filed for this visit.  Visit Diagnosis:  Sciatica associated with disorder of lumbar spine, left      Subjective Assessment - 08/19/15 1304    Subjective Pt reports he is doing better. pain is minimal at this time.    Pertinent History Patient has had a similar experience several years ago, sought PT services and found symptoms resolved for 1.5 years.    Limitations Sitting;Lifting;Standing;Walking;House hold activities   Diagnostic tests Nonenoted.    Patient Stated Goals To reduce pain levels, similar to previous PT encounter.    Currently in Pain? No/denies   Pain Score 0-No pain            Objective: Discontinued hip flexion stretching as this is worsening pt pain. (SLR)  Prone manual repositioning for hips and press ups, 8 min total.  Use of two pillows to achieve same position with no muscle activation.  Standing wt shift - produced pain in R calf.  Same performed with lumbar extension - decr. Pain but continued to have some pain in R calf.  Same performed with arms on TM using UE for wt shift, able to achieve pain free wt shifting. Cued pt on performance of this. Following this pt able to single leg WB with decr. Pain. Extensive  education on avoiding flexion and avoiding pain down RLE with exercise.                     PT Education - 08/19/15 1306    Education provided Yes   Education Details Educated on HEP to progress to incr. function.   Person(s) Educated Patient   Methods Explanation   Comprehension Verbalized understanding             PT Long Term Goals - 08/19/15 1341    PT LONG TERM GOAL #1   Title Patient will report VAS score of worst pain as less than 5/10 to demonstrate increased tolerance for ADLs.    Baseline 8/10   Time 4   Period Weeks   Status Achieved   PT LONG TERM GOAL #2   Title Patient will report an ODI score of less than 70% disability to demonstrate increased tolerance for ADLs.    Baseline 84%   Time 4   Period Weeks   Status Achieved   PT LONG TERM GOAL #3   Title Patient will report no increase in pain with turning in bed to allow for more comfortable sleep.   Time 4   Period Weeks   Status Achieved   PT LONG TERM GOAL #4   Title Patient will be independent with a HEP to increase his tolerance for ADLs  and decrease his symptoms.    Time 4   Period Weeks   Status Not Met               Plan - 08/19/15 1340    Clinical Impression Statement Pt experienced return of symptoms with SLR stretch/flexion exercises so returned to focus on extension exercises with more free lateral shifting.   Pt will benefit from skilled therapeutic intervention in order to improve on the following deficits Abnormal gait;Pain;Decreased mobility;Decreased strength;Impaired flexibility;Difficulty walking   Rehab Potential Good   Clinical Impairments Affecting Rehab Potential Pro- quick response, previous successful response, centralization in this session. Cons - long standing chronic pain. High FAB-Q score.    PT Frequency 2x / week   PT Duration 2 weeks   PT Treatment/Interventions Traction;Moist Heat;Iontophoresis 65m/ml Dexamethasone;Electrical Stimulation;Gait  training;Biofeedback;Patient/family education;Manual techniques;Therapeutic exercise;Therapeutic activities;Dry needling;Taping   PT Next Visit Plan Progression of HEP and manual tehcniques. Re-assess patient.    PT Home Exercise Plan See patient instructions   Consulted and Agree with Plan of Care Patient        Problem List There are no active problems to display for this patient.   Fisher,Benjamin PT 08/19/2015, 2:27 PM  [facility] [address] [place] [address]Phone: [phone]  Fax:  [phone] Name: Steven Luna MRN: [id number] of Birth: [birth year]

## 2015-08-22 ENCOUNTER — Ambulatory Visit: Payer: Self-pay

## 2015-08-22 ENCOUNTER — Ambulatory Visit: Payer: Self-pay | Admitting: Physical Therapy

## 2015-08-22 DIAGNOSIS — G8929 Other chronic pain: Secondary | ICD-10-CM

## 2015-08-22 DIAGNOSIS — M5442 Lumbago with sciatica, left side: Secondary | ICD-10-CM

## 2015-08-22 DIAGNOSIS — M5432 Sciatica, left side: Secondary | ICD-10-CM

## 2015-08-22 NOTE — Therapy (Signed)
[facility] [address] [place], [address] Phone: [phone]   Fax:  [phone]  Physical Therapy Treatment  Patient Details  Name: Steven Luna MRN: [id number] Date of Birth: March 19, 1971 No Data Recorded  Encounter Date: 08/22/2015      PT End of Session - 08/22/15 1303    Visit Number 10   Number of Visits 18   Date for PT Re-Evaluation 09/29/15   PT Start Time 6812   PT Stop Time 7517   PT Time Calculation (min) 44 min   Activity Tolerance Patient tolerated treatment well;No increased pain   Behavior During Therapy [facility] for tasks assessed/performed      No past medical history on file.  No past surgical history on file.  There were no vitals filed for this visit.  Visit Diagnosis:  Sciatica associated with disorder of lumbar spine, left  Chronic left-sided low back pain with left-sided sciatica      Subjective Assessment - 08/22/15 1305    Subjective My back is fine but as soon as I get up, pain goes down to both feet L  > R. Trying to do as best as he can with his HEP. Would also like for his hips to be even. When trying to wash his hands in the morning, has a hard time reaching for the faucet due to back pain.    Patient is accompained by: Interpreter   Pertinent History Patient has had a similar experience several years ago, sought PT services and found symptoms resolved for 1.5 years.    Limitations Sitting;Lifting;Standing;Walking;House hold activities   Diagnostic tests Nonenoted.    Patient Stated Goals To reduce pain levels, similar to previous PT encounter.    Currently in Pain? Yes   Pain Score 3   1/10 hips, 3/10 L foot       Objectives:  There-ex Standing R lateral shift correction 3x5 with 5 seconds (reviewed and given as part of his HEP 3x5 with 5 second holds 3x per day; pt demonstrated and verbalized understanding) L S/L R hip abduction 10x2 L S/L with pillow under  L hip (to correct R lateral shift) x 1 min Then with R hip abduction 10x  R S/L L hip abduction 10x3 (no back or LE pain) R S/L position x 1 min (to correct lateral shift)  Prone on elbows (proper hip positioned to correct lateral shift) x 2 min with deep breaths and bilateral hip IR Then 1 min with gentle manual pressure to R low back (increased bilateral knee discomfort)  Prone R glute max/quad set 10x2 with 5 second holds (decreased R trunk rotation, improved R lumbar extension in prone)   Improved exercise technique, movement at target joints, use of target muscles after mod verbal, visual, tactile cues.     LE symptoms and back pain decreased with lateral shift correction and gentle lumbar extension exercises.                              PT Education - 08/22/15 1313    Education provided Yes   Education Details ther-ex   [employer]) Educated Patient   Methods Explanation;Demonstration;Tactile cues;Verbal cues   Comprehension Verbalized understanding;Returned demonstration             PT Long Term Goals - 08/19/15 1341    PT LONG TERM GOAL #1   Title Patient will report VAS score  of worst pain as less than 5/10 to demonstrate increased tolerance for ADLs.    Baseline 8/10   Time 4   Period Weeks   Status Achieved   PT LONG TERM GOAL #2   Title Patient will report an ODI score of less than 70% disability to demonstrate increased tolerance for ADLs.    Baseline 84%   Time 4   Period Weeks   Status Achieved   PT LONG TERM GOAL #3   Title Patient will report no increase in pain with turning in bed to allow for more comfortable sleep.   Time 4   Period Weeks   Status Achieved   PT LONG TERM GOAL #4   Title Patient will be independent with a HEP to increase his tolerance for ADLs and decrease his symptoms.    Time 4   Period Weeks   Status Not Met               Plan - 08/22/15 1315    Clinical Impression Statement LE symptoms and  back pain decreased with lateral shift correction and gentle lumbar extension exercises. 0/10 low back pain, 0-1/10 bilateral LE symptoms afterwards    Pt will benefit from skilled therapeutic intervention in order to improve on the following deficits Abnormal gait;Pain;Decreased mobility;Decreased strength;Impaired flexibility;Difficulty walking   Rehab Potential Good   Clinical Impairments Affecting Rehab Potential Pro- quick response, previous successful response, centralization in this session. Cons - long standing chronic pain. High FAB-Q score.    PT Frequency 2x / week   PT Duration 2 weeks   PT Treatment/Interventions Traction;Moist Heat;Iontophoresis 78m/ml Dexamethasone;Electrical Stimulation;Gait training;Biofeedback;Patient/family education;Manual techniques;Therapeutic exercise;Therapeutic activities;Dry needling;Taping   PT Next Visit Plan Progression of HEP and manual tehcniques. Re-assess patient.    PT Home Exercise Plan See patient instructions   Consulted and Agree with Plan of Care Patient        Problem List There are no active problems to display for this patient.  MJoneen BoersPT, DPT   08/22/2015, 7:43 PM  [facility] [address] [place] [address]Phone: [phone]  Fax:  [phone] Name: [name] MRN: [id number] of Birth: [birth year]

## 2015-08-26 ENCOUNTER — Ambulatory Visit: Payer: Self-pay | Admitting: Physical Therapy

## 2015-08-26 DIAGNOSIS — G8929 Other chronic pain: Secondary | ICD-10-CM

## 2015-08-26 DIAGNOSIS — M5432 Sciatica, left side: Secondary | ICD-10-CM

## 2015-08-26 DIAGNOSIS — M5442 Lumbago with sciatica, left side: Secondary | ICD-10-CM

## 2015-08-26 NOTE — Therapy (Signed)
[facility] PH[facility] SPORTS MEDIC[facility] [address] [place], [address] Phone: [phone]   Fax:  [phone]  Physical Therapy Treatment  Patient Details  Name: Steven Luna MRN: [id number] Date of Birth: Oct 09, 1970 No Data Recorded  Encounter Date: 08/26/2015      PT End of Session - 08/26/15 1336    Visit Number 11   Number of Visits 18   Date for PT Re-Evaluation 09/29/15   PT Start Time 1300   PT Stop Time 0932   PT Time Calculation (min) 38 min   Activity Tolerance Patient tolerated treatment well;No increased pain   Behavior During Therapy [facility] for tasks assessed/performed      No past medical history on file.  No past surgical history on file.  There were no vitals filed for this visit.  Visit Diagnosis:  Sciatica associated with disorder of lumbar spine, left  Chronic left-sided low back pain with left-sided sciatica      Subjective Assessment - 08/26/15 1301    Subjective Pt reports he is doing "a little better" today. Very mild pain in L calf, "it feels like a needle". No change in hip height.   Patient is accompained by: Interpreter   Pertinent History Patient has had a similar experience several years ago, sought PT services and found symptoms resolved for 1.5 years.    Limitations Sitting;Lifting;Standing;Walking;House hold activities   Diagnostic tests Nonenoted.    Patient Stated Goals To reduce pain levels, similar to previous PT encounter.    Currently in Pain? No/denies   Pain Score 0-No pain         Objective.  Prone lying with hip correction, 4 min with passive rolling of thighs to facilitate relaxation (IR/ER).  Extensive grade IV mobilization L1-L5 4x1 min each. Following this pt had 30% improvement in self reported difficulty dropping R hip but unable to achieve neutral spine.  Palpated significant painful region L paraspinals in lateral region. With "medial glide" of this region pt had  exquisite pain. With continued performance pain decr. To 0/10.  Following this pt able to amb with no hip elevation. "This is the first time I have been able to do this."  Issued new HEP of hip circles with extensive cuin to avoid pain with performance of this, including cuing to avoid N/T.                        PT Education - 08/26/15 1304    Education provided Yes   Education Details educated pt on hip circles as HEP   Person(s) Educated Patient   Methods Explanation   Comprehension Verbalized understanding             PT Long Term Goals - 08/19/15 1341    PT LONG TERM GOAL #1   Title Patient will report VAS score of worst pain as less than 5/10 to demonstrate increased tolerance for ADLs.    Baseline 8/10   Time 4   Period Weeks   Status Achieved   PT LONG TERM GOAL #2   Title Patient will report an ODI score of less than 70% disability to demonstrate increased tolerance for ADLs.    Baseline 84%   Time 4   Period Weeks   Status Achieved   PT LONG TERM GOAL #3   Title Patient will report no increase in pain with turning in bed to allow for more comfortable sleep.   Time  4   Period Weeks   Status Achieved   PT LONG TERM GOAL #4   Title Patient will be independent with a HEP to increase his tolerance for ADLs and decrease his symptoms.    Time 4   Period Weeks   Status Not Met               Plan - 08/26/15 1337    Clinical Impression Statement Improvement in hip height deviation within session. Pain is nearly resolved. Will continue to treat to ensure this improvement is maintained.   Pt will benefit from skilled therapeutic intervention in order to improve on the following deficits Abnormal gait;Pain;Decreased mobility;Decreased strength;Impaired flexibility;Difficulty walking   Rehab Potential Good   Clinical Impairments Affecting Rehab Potential Pro- quick response, previous successful response, centralization in this session. Cons -  long standing chronic pain. High FAB-Q score.    PT Frequency 2x / week   PT Duration 2 weeks   PT Treatment/Interventions Traction;Moist Heat;Iontophoresis 42m/ml Dexamethasone;Electrical Stimulation;Gait training;Biofeedback;Patient/family education;Manual techniques;Therapeutic exercise;Therapeutic activities;Dry needling;Taping   PT Next Visit Plan Progression of HEP and manual tehcniques. Re-assess patient.    PT Home Exercise Plan See patient instructions   Consulted and Agree with Plan of Care Patient        Problem List There are no active problems to display for this patient.   Fisher,Benjamin PT DPT 08/26/2015, 1:38 PM  [facility] [address] [place] [address]Phone: [phone]  Fax:  [phone] Name: Steven Luna MRN: [id number] of Birth: [birth year]

## 2015-08-29 ENCOUNTER — Ambulatory Visit: Payer: Self-pay | Admitting: Physical Therapy

## 2015-08-29 DIAGNOSIS — M5432 Sciatica, left side: Secondary | ICD-10-CM

## 2015-08-29 NOTE — Therapy (Signed)
[facility] [address] [place], [address] Phone: [phone]   Fax:  [phone]  Physical Therapy Treatment  Patient Details  Name: Steven Luna MRN: [id number] Date of Birth: 10/23/1970 No Data Recorded  Encounter Date: 08/29/2015      PT End of Session - 08/29/15 1329    Visit Number 12   Number of Visits 18   Date for PT Re-Evaluation 09/29/15   PT Start Time 1310   PT Stop Time 1335   PT Time Calculation (min) 25 min   Activity Tolerance Patient tolerated treatment well;No increased pain   Behavior During Therapy [facility] for tasks assessed/performed      No past medical history on file.  No past surgical history on file.  There were no vitals filed for this visit.  Visit Diagnosis:  Sciatica associated with disorder of lumbar spine, left      Subjective Assessment - 08/29/15 1311    Subjective Pt reports he was doing significantly better until this morning, this morning pt had significant pain upon standing.   Patient is accompained by: Interpreter   Pertinent History Patient has had a similar experience several years ago, sought PT services and found symptoms resolved for 1.5 years.    Limitations Sitting;Lifting;Standing;Walking;House hold activities   Diagnostic tests Nonenoted.    Patient Stated Goals To reduce pain levels, similar to previous PT encounter.    Currently in Pain? Yes   Pain Score 1    Pain Location Calf   Pain Orientation Left           Objective:  Reviewed sit<>stand to ensure pt is not doing anything to irritate his back, modified to decr. Lumbar flex. With performance.  Prone CPAs grade III L1-L5 3x1 min.   STM mobs performed on ILS muscles on L. 10 min total.  Reassessed for trigger points in L HS, IT, calf - all negative, despite pain in calf.  Pt able to amb following session much better - pleased with his overall  response.                      PT Education - 08/29/15 1313    Education provided Yes   Education Details centralization   Person(s) Educated Patient   Methods Explanation   Comprehension Verbalized understanding             PT Long Term Goals - 08/19/15 1341    PT LONG TERM GOAL #1   Title Patient will report VAS score of worst pain as less than 5/10 to demonstrate increased tolerance for ADLs.    Baseline 8/10   Time 4   Period Weeks   Status Achieved   PT LONG TERM GOAL #2   Title Patient will report an ODI score of less than 70% disability to demonstrate increased tolerance for ADLs.    Baseline 84%   Time 4   Period Weeks   Status Achieved   PT LONG TERM GOAL #3   Title Patient will report no increase in pain with turning in bed to allow for more comfortable sleep.   Time 4   Period Weeks   Status Achieved   PT LONG TERM GOAL #4   Title Patient will be independent with a HEP to increase his tolerance for ADLs and decrease his symptoms.    Time 4   Period Weeks   Status Not Met  Plan - 08/29/15 1330    Clinical Impression Statement Pt has made definite improvement in ability to amb. while bearing weight through B hips/LE. No modification of HEP at this time as pt is making progress with this.   Pt will benefit from skilled therapeutic intervention in order to improve on the following deficits Abnormal gait;Pain;Decreased mobility;Decreased strength;Impaired flexibility;Difficulty walking   Rehab Potential Good   Clinical Impairments Affecting Rehab Potential Pro- quick response, previous successful response, centralization in this session. Cons - long standing chronic pain. High FAB-Q score.    PT Frequency 2x / week   PT Duration 2 weeks   PT Treatment/Interventions Traction;Moist Heat;Iontophoresis 45m/ml Dexamethasone;Electrical Stimulation;Gait training;Biofeedback;Patient/family education;Manual techniques;Therapeutic  exercise;Therapeutic activities;Dry needling;Taping   PT Next Visit Plan Progression of HEP and manual tehcniques. Re-assess patient.    PT Home Exercise Plan See patient instructions   Consulted and Agree with Plan of Care Patient        Problem List There are no active problems to display for this patient.   Fisher,Benjamin PT DPT 08/29/2015, 2:04 PM  [facility] [address] [place] [address]Phone: [phone]  Fax:  [phone] Name: [name] MRN: [id number] of Birth: [birth year]

## 2015-09-12 ENCOUNTER — Encounter: Payer: Self-pay | Admitting: Physical Therapy

## 2015-09-12 ENCOUNTER — Ambulatory Visit: Payer: Self-pay | Admitting: Physical Therapy

## 2015-09-12 DIAGNOSIS — M5432 Sciatica, left side: Secondary | ICD-10-CM

## 2015-09-12 NOTE — Therapy (Signed)
[facility] [address] [place], [address] Phone: [phone]   Fax:  [phone]  Physical Therapy Treatment  Patient Details  Name: Steven Luna MRN: [id number] Date of Birth: 1970/11/24 No Data Recorded  Encounter Date: 09/12/2015      PT End of Session - 09/12/15 8309    Visit Number 13   Number of Visits 18   Date for PT Re-Evaluation 09/29/15   PT Start Time 0815   PT Stop Time 4076   PT Time Calculation (min) 32 min   Activity Tolerance Patient limited by pain   Behavior During Therapy [facility] for tasks assessed/performed      No past medical history on file.  No past surgical history on file.  There were no vitals filed for this visit.  Visit Diagnosis:  Sciatica associated with disorder of lumbar spine, left      Subjective Assessment - 09/12/15 0817    Subjective Pt pain returned severely. Pt was doing better, he feels that his new exercises produced his pain.   Patient is accompained by: Interpreter   Pertinent History Patient has had a similar experience several years ago, sought PT services and found symptoms resolved for 1.5 years.    Limitations Sitting;Lifting;Standing;Walking;House hold activities   Diagnostic tests Nonenoted.    Patient Stated Goals To reduce pain levels, similar to previous PT encounter.    Currently in Pain? Yes   Pain Score 4    Pain Location Back   Pain Orientation Left          Objective:  Discontinued pain inducing wt shift exercise.  Prone positioning with hip shift. 5 min.  Same with Central and unilateral PAs L3-L5 3x1 min each grade I-3, incr. Grade with each bout. Following this pain improved to 1/10.  STM performed on L paraspinals in same region.  Prone press ups 3x15.  Prone press ups with overpeassure 3x10.  Encouraged pt to continue to perform prone press ups following this.                       PT  Education - 09/12/15 [phone]    Education provided Yes   Education Details avoiding previous exercises which may have been producing some of his pain.   Person(s) Educated Patient   Methods Explanation   Comprehension Verbalized understanding             PT Long Term Goals - 08/19/15 1341    PT LONG TERM GOAL #1   Title Patient will report VAS score of worst pain as less than 5/10 to demonstrate increased tolerance for ADLs.    Baseline 8/10   Time 4   Period Weeks   Status Achieved   PT LONG TERM GOAL #2   Title Patient will report an ODI score of less than 70% disability to demonstrate increased tolerance for ADLs.    Baseline 84%   Time 4   Period Weeks   Status Achieved   PT LONG TERM GOAL #3   Title Patient will report no increase in pain with turning in bed to allow for more comfortable sleep.   Time 4   Period Weeks   Status Achieved   PT LONG TERM GOAL #4   Title Patient will be independent with a HEP to increase his tolerance for ADLs and decrease his symptoms.    Time 4   Period Weeks   Status Not  Met               Plan - 09/12/15 4695    Clinical Impression Statement Significant incr. in pain, gait is now significantly worse. Within session pt had significant improvement in pain. Deferred further treatment today due to previous high degree of pain.   Pt will benefit from skilled therapeutic intervention in order to improve on the following deficits Abnormal gait;Pain;Decreased mobility;Decreased strength;Impaired flexibility;Difficulty walking   Rehab Potential Good   Clinical Impairments Affecting Rehab Potential Pro- quick response, previous successful response, centralization in this session. Cons - long standing chronic pain. High FAB-Q score.    PT Frequency 2x / week   PT Duration 2 weeks   PT Treatment/Interventions Traction;Moist Heat;Iontophoresis 98m/ml Dexamethasone;Electrical Stimulation;Gait training;Biofeedback;Patient/family  education;Manual techniques;Therapeutic exercise;Therapeutic activities;Dry needling;Taping   PT Next Visit Plan Progression of HEP and manual tehcniques. Re-assess patient.    PT Home Exercise Plan See patient instructions   Consulted and Agree with Plan of Care Patient        Problem List There are no active problems to display for this patient.   Jarett Dralle PT DPT 09/12/2015, 8:49 AM  [facility] [address] [place] [address]Phone: [phone]  Fax:  [phone] Name: Steven Luna MRN: [id number] of Birth: [birth year]

## 2015-09-19 ENCOUNTER — Ambulatory Visit: Payer: Self-pay | Attending: Physical Medicine and Rehabilitation | Admitting: Physical Therapy

## 2015-09-19 DIAGNOSIS — M5442 Lumbago with sciatica, left side: Secondary | ICD-10-CM | POA: Insufficient documentation

## 2015-09-19 DIAGNOSIS — M6281 Muscle weakness (generalized): Secondary | ICD-10-CM | POA: Insufficient documentation

## 2015-09-19 DIAGNOSIS — G8929 Other chronic pain: Secondary | ICD-10-CM | POA: Insufficient documentation

## 2015-09-19 DIAGNOSIS — M5432 Sciatica, left side: Secondary | ICD-10-CM | POA: Insufficient documentation

## 2015-09-19 NOTE — Therapy (Signed)
[place] [facility] [address] [place], [address] Phone: [phone]   Fax:  [phone]  Physical Therapy Treatment  Patient Details  Name: Steven Luna MRN: [id number] Date of Birth: [birth year] No Data Recorded  Encounter Date: 09/19/2015      PT End of Session - 09/19/15 1423    Visit Number 14   Number of Visits 18   Date for PT Re-Evaluation 09/29/15   PT Start Time 9323   PT Stop Time 1420   PT Time Calculation (min) 25 min   Activity Tolerance Patient limited by pain   Behavior During Therapy [facility] for tasks assessed/performed      No past medical history on file.  No past surgical history on file.  There were no vitals filed for this visit.  Visit Diagnosis:  Sciatica associated with disorder of lumbar spine, left  Chronic left-sided low back pain with left-sided sciatica      Subjective Assessment - 09/19/15 1353    Subjective Pt reported pain and pins and needles all down the L leg iin sciatic nerve distribution.  Pt stated he likes to walk but if he walks too long he feels like he will fall down. Pt feels weakness in B LE which is a new symptom for him. PT encouraged pt to call MD to discuss this. Pt also reports feeling of cold in same region as numbness periodically.   Patient is accompained by: Interpreter   Pertinent History Patient has had a similar experience several years ago, sought PT services and found symptoms resolved for 1.5 years.    Limitations Sitting;Lifting;Standing;Walking;House hold activities   Diagnostic tests Nonenoted.    Patient Stated Goals To reduce pain levels, similar to previous PT encounter.    Currently in Pain? Yes   Pain Score 5    Pain Location Back   Pain Orientation Left;Lower           Objective:  Pt presented with lat. shift of hips to the right and had incr. in numbness, pins, and needles in bilat. LE. with repeated ext. x10  Prone shift  correction while maintaining position for 1 min. Reported decr. Pain, possibly due to prone position allowing relaxation of back muscles. CPA and UPA L3-5 Gr. 3 for 3x45" to incr. motion in lumbar spine and decr. pain.  Pt reported significant decr. in pain and incr. in motion after mobilization. "I have no pain." Pt's symptoms partially centralized after lat. shift correction with ext. Initially only lat shift correction was performed with poor response. Significant improvement was noted in gait with a more upright posture and no lat shift.  A decr. in pain was also noted.                      PT Education - 09/19/15 1416    Education provided Yes   Education Details Pt was educated on the reaoning behind the lat shift as well as the new HEP.   Person(s) Educated Patient   Methods Explanation;Demonstration;Tactile cues   Comprehension Verbalized understanding;Returned demonstration;Verbal cues required             PT Long Term Goals - 08/19/15 1341    PT LONG TERM GOAL #1   Title Patient will report VAS score of worst pain as less than 5/10 to demonstrate increased tolerance for ADLs.    Baseline 8/10   Time 4   Period Weeks   Status  Achieved   PT LONG TERM GOAL #2   Title Patient will report an ODI score of less than 70% disability to demonstrate increased tolerance for ADLs.    Baseline 84%   Time 4   Period Weeks   Status Achieved   PT LONG TERM GOAL #3   Title Patient will report no increase in pain with turning in bed to allow for more comfortable sleep.   Time 4   Period Weeks   Status Achieved   PT LONG TERM GOAL #4   Title Patient will be independent with a HEP to increase his tolerance for ADLs and decrease his symptoms.    Time 4   Period Weeks   Status Not Met               Plan - 09/19/15 1418    Clinical Impression Statement PT significantly improved during treatment session as evidenced by the correction in the lat shift and decr.  in pain, as well as ability to bear weight on R LE.  Session was limited due to pt improvement and liklihood of re-irritation with continued PT.   Pt will benefit from skilled therapeutic intervention in order to improve on the following deficits Abnormal gait;Pain;Decreased mobility;Decreased strength;Impaired flexibility;Difficulty walking   Rehab Potential Good   Clinical Impairments Affecting Rehab Potential Pro- quick response, previous successful response, centralization in this session. Cons - long standing chronic pain. High FAB-Q score.    PT Frequency 2x / week   PT Duration 2 weeks   PT Treatment/Interventions Traction;Moist Heat;Iontophoresis 56m/ml Dexamethasone;Electrical Stimulation;Gait training;Biofeedback;Patient/family education;Manual techniques;Therapeutic exercise;Therapeutic activities;Dry needling;Taping   PT Next Visit Plan Progression of HEP and manual tehcniques. Re-assess patient.    PT Home Exercise Plan See patient instructions   Consulted and Agree with Plan of Care Patient        Problem List There are no active problems to display for this patient.   Matheau Orona PT DPT 09/19/2015, 2:50 PM  [facility] [address] [place] [address]Phone: [phone]  Fax:  [phone] Name: Steven Luna MRN: [id number] of Birth: [birth year]

## 2015-09-23 ENCOUNTER — Encounter: Payer: Self-pay | Admitting: Physical Therapy

## 2015-09-24 ENCOUNTER — Encounter: Payer: Self-pay | Admitting: Physical Therapy

## 2015-09-25 ENCOUNTER — Ambulatory Visit: Payer: Self-pay | Admitting: Physical Therapy

## 2015-09-25 DIAGNOSIS — M5432 Sciatica, left side: Secondary | ICD-10-CM

## 2015-09-25 DIAGNOSIS — M5442 Lumbago with sciatica, left side: Principal | ICD-10-CM

## 2015-09-25 DIAGNOSIS — G8929 Other chronic pain: Secondary | ICD-10-CM

## 2015-09-25 NOTE — Therapy (Signed)
[place] [facility] [address], [place], [address] Phone: [phone]   Fax:  [phone]  Physical Therapy Treatment  Patient Details  Name: Steven Luna MRN: [id number] Date of Birth: 1971/05/26 No Data Recorded  Encounter Date: 09/25/2015      PT End of Session - 09/25/15 0957    Visit Number 15   Number of Visits 17   Date for PT Re-Evaluation 09/29/15   PT Start Time 0900   PT Stop Time 0945   PT Time Calculation (min) 45 min   Activity Tolerance Patient limited by pain;No increased pain   Behavior During Therapy [facility] for tasks assessed/performed      No past medical history on file.  No past surgical history on file.  There were no vitals filed for this visit.  Visit Diagnosis:  Chronic left-sided low back pain with left-sided sciatica  Sciatica associated with disorder of lumbar spine, left      Subjective Assessment - 09/25/15 0949    Subjective Pt reported his pain is the same as his last visit, but decreased pins/needles down posterior L leg.  Pt complains of extreme fatigue in B LE below knee as if he "has been walking for a long time"   Patient is accompained by: Interpreter   Pertinent History Patient has had a similar experience several years ago, sought PT services and found symptoms resolved for 1.5 years.    Limitations Sitting;Lifting;Standing;Walking;House hold activities   Diagnostic tests Nonenoted.    Patient Stated Goals To reduce pain levels, similar to previous PT encounter.    Currently in Pain? Yes   Pain Score 5    Pain Location Back   Pain Orientation Left   Pain Onset More than a month ago       Objective:  STM done on L ILS muscle oscillatory lumbar spine to decrease pain/tension. Pt expressed decreased pain. Noted decreased tone/guarding. PA grade 4 mobs done L2-L5 3X30. Pt reported a further decreased pain. Notable decrease in tone/guarding.    Prone lateral  hip shift performed followed by extension exercises on elbows 3X45 sec. pt. was cued for continued breathing and shoulder relaxation throughout. Pt initially had difficulty getting into extension position and required gradual and slow progression, initially with a pillow under chest and progressing to extension on wide elbows and finally on elbows under shoulders.  Supine hooklyeing ADIM instructed w/ alternating LE elevation 2X5 w/ 5 sec holds when LE in elevated position. Pt required regular cueing for proper breathing due to breath holding and chest breathing indicating poor trunk motor control.  Standing lateral shift with extension performed for 45 sec and oscillating mobilization followed by gait observation and instruction on decreasing shoulder shrug and maintaining breathing. A final lateral shift was performed without extension prior to pt leaving.                          PT Education - 09/25/15 0956    Education provided Yes   Education Details pt was instructed in breathing techniques and ADIM during transfers, walking, and during HEP   Person(s) Educated Patient   Methods Explanation;Demonstration;Tactile cues;Verbal cues   Comprehension Verbalized understanding;Returned demonstration;Verbal cues required;Tactile cues required;Need further instruction             PT Long Term Goals - 08/19/15 1341    PT LONG TERM GOAL #1   Title Patient will report VAS score  of worst pain as less than 5/10 to demonstrate increased tolerance for ADLs.    Baseline 8/10   Time 4   Period Weeks   Status Achieved   PT LONG TERM GOAL #2   Title Patient will report an ODI score of less than 70% disability to demonstrate increased tolerance for ADLs.    Baseline 84%   Time 4   Period Weeks   Status Achieved   PT LONG TERM GOAL #3   Title Patient will report no increase in pain with turning in bed to allow for more comfortable sleep.   Time 4   Period Weeks   Status  Achieved   PT LONG TERM GOAL #4   Title Patient will be independent with a HEP to increase his tolerance for ADLs and decrease his symptoms.    Time 4   Period Weeks   Status Not Met               Plan - 09/25/15 0959    Clinical Impression Statement pt. responded well to therapy, but has had little to no carryover between sessions, so incr. focus on therapeutic exercise/self care in order to improve between treatment symptoms. Post treatment pt. has decr. pain/lateral shift during gait.  pt. will benifit from core stabilization in future visits to address carryover and reduce reliance on PT   Pt will benefit from skilled therapeutic intervention in order to improve on the following deficits Abnormal gait;Pain;Decreased mobility;Decreased strength;Impaired flexibility;Difficulty walking   Rehab Potential Good   Clinical Impairments Affecting Rehab Potential Pro- quick response, previous successful response, centralization in this session. Cons - long standing chronic pain. High FAB-Q score.    PT Frequency 2x / week   PT Duration 2 weeks   PT Treatment/Interventions Traction;Moist Heat;Iontophoresis 65m/ml Dexamethasone;Electrical Stimulation;Gait training;Biofeedback;Patient/family education;Manual techniques;Therapeutic exercise;Therapeutic activities;Dry needling;Taping   PT Next Visit Plan Progression of supine core stabilization exercises, lateral shift/manual therapy prn   PT Home Exercise Plan See patient instructions   Consulted and Agree with Plan of Care Patient        Problem List There are no active problems to display for this patient.   Mehak Roskelley PT, DPT 09/25/2015, 10:29 AM  [facility] [address] [place] [address]Phone: [phone]  Fax:  [phone] Name: Steven Luna MRN: [id number] of Birth: [birth year]

## 2015-09-26 ENCOUNTER — Ambulatory Visit: Payer: Self-pay | Admitting: Physical Therapy

## 2015-09-26 DIAGNOSIS — G8929 Other chronic pain: Secondary | ICD-10-CM

## 2015-09-26 DIAGNOSIS — M5432 Sciatica, left side: Secondary | ICD-10-CM

## 2015-09-26 DIAGNOSIS — M5442 Lumbago with sciatica, left side: Secondary | ICD-10-CM

## 2015-09-26 NOTE — Therapy (Signed)
[facility] [address] [place], [address] Phone: [phone]   Fax:  [phone]  Physical Therapy Treatment  Patient Details  Name: Steven Luna MRN: [id number] Date of Birth: April 22, 1971 No Data Recorded  Encounter Date: 09/26/2015      PT End of Session - 09/26/15 1103    Visit Number 16   Number of Visits 17   Date for PT Re-Evaluation 09/29/15   PT Start Time 0900   PT Stop Time 0940   PT Time Calculation (min) 40 min   Activity Tolerance Patient limited by pain;No increased pain   Behavior During Therapy [facility] for tasks assessed/performed      No past medical history on file.  No past surgical history on file.  There were no vitals filed for this visit.  Visit Diagnosis:  Sciatica associated with disorder of lumbar spine, left  Chronic left-sided low back pain with left-sided sciatica      Subjective Assessment - 09/26/15 1057    Subjective pt has decr. pain as of last time, continues to feel tingling in L foot, but no complaints of pins/needles down posterior leg. "I'm doing better today."   Patient is accompained by: Interpreter   Pertinent History Patient has had a similar experience several years ago, sought PT services and found symptoms resolved for 1.5 years.    Limitations Sitting;Lifting;Standing;Walking;House hold activities   Diagnostic tests Nonenoted.    Patient Stated Goals To reduce pain levels, similar to previous PT encounter.    Currently in Pain? Yes   Pain Score 1    Pain Onset More than a month ago            Objective:  Prone Grade 3 PA mobs L2-L5, STM of L paraspinals to decr muscle tension. Pt was extensively aligned in neutral spinal prone w/ pillow under lower leg and instructed to attempt to attain the same position at home to decrease pain prn.  Gait observation done to assess lat shift. Pt was then asked to align himself in prone in the same  neutral position attained by PT to assess body position awareness. Minor adjustments were made to decr lat shift.   Performed manual lateral shift X 3 at 30 sec each adding extension of lumbar spine at the end of each lat shift hold. Pt responded well with decreased symptoms and visibly decr in lat shift.  Educated pt in self lat shift correction in sidelying w/ towel under side at lumbar spine and pillow under heat to attain neutral spine. Also educated pt in self lat shift correction in standing and shifting against doorframe while looking in a mirror for improved body awareness and biofeedback.   Repeated extensions in standing performed 2X10 w/ abolished tingling in L foot. Tactile and verbal cueing to relax shoulders and increase extension at lumbar area. Pt expressed preference for self lat shift correction in sidelying and repeated extensions in standing.                      PT Education - 09/26/15 1059    Education provided Yes   Education Details pt was educated in the performance of repeated extensions in lying and standing. Also educated in self lat shift in sidelying and standing. Educated to perform repeated extensions and self lat shift correction in position of comfort.    Person(s) Educated Patient   Methods Explanation;Demonstration;Tactile cues;Verbal cues   Comprehension Verbalized understanding;Returned demonstration;Verbal  cues required;Tactile cues required             PT Long Term Goals - 08/19/15 1341    PT LONG TERM GOAL #1   Title Patient will report VAS score of worst pain as less than 5/10 to demonstrate increased tolerance for ADLs.    Baseline 8/10   Time 4   Period Weeks   Status Achieved   PT LONG TERM GOAL #2   Title Patient will report an ODI score of less than 70% disability to demonstrate increased tolerance for ADLs.    Baseline 84%   Time 4   Period Weeks   Status Achieved   PT LONG TERM GOAL #3   Title Patient will report no  increase in pain with turning in bed to allow for more comfortable sleep.   Time 4   Period Weeks   Status Achieved   PT LONG TERM GOAL #4   Title Patient will be independent with a HEP to increase his tolerance for ADLs and decrease his symptoms.    Time 4   Period Weeks   Status Not Met               Plan - 09/26/15 1105    Clinical Impression Statement pt responded well to repeated extension in standing and lat shift correction in sidelying as demonstrated by abolished tingling in L foot and decreased pain. pt will benefit from additional instruction in ADIM and progression of supine lumbar stabilization progression.    Pt will benefit from skilled therapeutic intervention in order to improve on the following deficits Abnormal gait;Pain;Decreased mobility;Decreased strength;Impaired flexibility;Difficulty walking   Rehab Potential Good   Clinical Impairments Affecting Rehab Potential Pro- quick response, previous successful response, centralization in this session. Cons - long standing chronic pain. High FAB-Q score.    PT Frequency 2x / week   PT Duration 2 weeks   PT Treatment/Interventions Traction;Moist Heat;Iontophoresis 68m/ml Dexamethasone;Electrical Stimulation;Gait training;Biofeedback;Patient/family education;Manual techniques;Therapeutic exercise;Therapeutic activities;Dry needling;Taping   PT Next Visit Plan Assess ability to allign neutral spine in prone, assess standing repeated extensions. Progression of supine core stabilization exercises   PT Home Exercise Plan See patient instructions   Consulted and Agree with Plan of Care Patient        Problem List There are no active problems to display for this patient.   Fisher,Benjamin PT DPT 09/26/2015, 11:24 AM  [facility] [address] [place] [address]Phone: [phone]  Fax:  [phone] Name: Steven Luna MRN:  [id number] of Birth: [birth year]

## 2015-10-01 ENCOUNTER — Ambulatory Visit: Payer: Self-pay | Admitting: Physical Therapy

## 2015-10-01 DIAGNOSIS — M6281 Muscle weakness (generalized): Secondary | ICD-10-CM

## 2015-10-01 DIAGNOSIS — M5442 Lumbago with sciatica, left side: Secondary | ICD-10-CM

## 2015-10-01 DIAGNOSIS — G8929 Other chronic pain: Secondary | ICD-10-CM

## 2015-10-01 NOTE — Therapy (Signed)
[place] [facility] [address], [place], [address] Phone: [phone]   Fax:  [phone]  Physical Therapy Treatment  Patient Details  Name: Steven Luna MRN: [id number] Date of Birth: [birth year] No Data Recorded  Encounter Date: 10/01/2015      PT End of Session - 10/01/15 0852    Visit Number 17   Number of Visits 17   Date for PT Re-Evaluation 09/29/15   PT Start Time 0815   PT Stop Time 1157   PT Time Calculation (min) 40 min   Activity Tolerance No increased pain;Patient tolerated treatment well   Behavior During Therapy [facility] for tasks assessed/performed      No past medical history on file.  No past surgical history on file.  There were no vitals filed for this visit.  Visit Diagnosis:  Muscle weakness - Plan: PT plan of care cert/re-cert  Chronic left-sided low back pain with left-sided sciatica - Plan: PT plan of care cert/re-cert      Subjective Assessment - 10/01/15 0815    Subjective Pt c/o a "very small pain" in the ant L hip during HEP abdominal drawin in maneuver w/ hip flexion, but "the pain is not very strong."  Pt descibes pain as a pinching type pain.   Patient is accompained by: Interpreter   Pertinent History Patient has had a similar experience several years ago, sought PT services and found symptoms resolved for 1.5 years.    Limitations Sitting;Lifting;Standing;Walking;House hold activities   Diagnostic tests Nonenoted.    Patient Stated Goals To reduce pain levels, similar to previous PT encounter.    Currently in Pain? Yes   Pain Score 7    Pain Location Hip   Pain Orientation Left;Anterior;Proximal   Pain Onset More than a month ago   Multiple Pain Sites No          Objective: Lateral hip shift correction performed in prone, as a mild R lateral shift was noted.   STM to L paraspinals to help relax musculature, decr pain, and incr mobility. Pt responded well as  seen with report of pain and decreased tissue tension/TPs.  Grade 3 CPA mobs done at L3-L5 for 45 sec X 3. Pt responded well and reported he is no longer having pain down his leg with this exercise as he has often in the past.   Repeated ext in prone with compression on low back 2x10 to decr pain and incr mobility.  Pt responded well saying that the pain doesn't not go down into his legs like it did before. Abdominal progression with abdominal drawing in maneuver                       PT Education - 10/01/15 0835    Education provided Yes   Education Details Pt was extensively educated in abdominal drawing in maneuver technique to reduce hip anterior rotation during leg movement, while also continuing to breath.    Person(s) Educated Patient   Methods Explanation;Demonstration;Tactile cues;Verbal cues   Comprehension Verbalized understanding;Returned demonstration             PT Long Term Goals - 10/01/15 0903    PT LONG TERM GOAL #1   Title Patient will be able to march in place 5 times each leg with 0/10 NPRS w/o cueing for continued breathing, to enable pt to move from sit to stand w/o pain.   Baseline 7/10   Time  4   Period Weeks   Status New   PT LONG TERM GOAL #2   Title Patient will be able to perform SLS for 10 sec each leg w/ 0/10 NPRS w/o cueing for continued breathing, to increase symmetrical WB and improve ambulation.   Baseline 7/10   Time 4   Period Weeks   Status New   PT LONG TERM GOAL #3   Title Patient will report no increase in pain with turning in bed to allow for more comfortable sleep.   Time 4   Period Weeks   Status Achieved   PT LONG TERM GOAL #4   Title Patient will be independent with a HEP to increase his tolerance for ADLs and decrease his symptoms.    Time 4   Period Weeks   Status Not Met               Plan - 10/01/15 0839    Clinical Impression Statement Pt responded well to repeated ext in prone saying that he  did not feel pain go down into his legs like he did before.  After MT, pt stated that he felt less shifted and could put more weight on his L LE.  Pt also responded well to the abdominal progression and abdominal drawing in maneuver.  He stated that he did not feel pain like he did when he performed this technique last time. Pt has a strong tendency for breathe holding during mild exertion, which needs further addressing. Pt would continue to benefit from skilled PT to address lingering pain and self care education for spinal stabilization.    Pt will benefit from skilled therapeutic intervention in order to improve on the following deficits Abnormal gait;Pain;Decreased mobility;Decreased strength;Impaired flexibility;Difficulty walking   Rehab Potential Good   Clinical Impairments Affecting Rehab Potential Pro- quick response, previous successful response, centralization in this session. Cons - long standing chronic pain. High FAB-Q score.    PT Frequency 2x / week   PT Duration 2 weeks   PT Treatment/Interventions Traction;Moist Heat;Iontophoresis 35m/ml Dexamethasone;Electrical Stimulation;Gait training;Biofeedback;Patient/family education;Manual techniques;Therapeutic exercise;Therapeutic activities;Dry needling;Taping   PT Next Visit Plan Assess ability to allign neutral spine in prone, assess standing repeated extensions. Progression of supine core stabilization exercises   PT Home Exercise Plan See patient instructions   Consulted and Agree with Plan of Care Patient        Problem List There are no active problems to display for this patient.   Gwyndolyn Guilford PT DPT 10/01/2015, 10:37 AM  [facility] [address] [place] [address]Phone: [phone]  Fax:  [phone] Name: Steven Luna MRN: [id number] of Birth: 1July 15, 1972

## 2015-10-03 ENCOUNTER — Ambulatory Visit: Payer: Self-pay | Admitting: Physical Therapy

## 2015-10-03 DIAGNOSIS — M5442 Lumbago with sciatica, left side: Principal | ICD-10-CM

## 2015-10-03 DIAGNOSIS — M6281 Muscle weakness (generalized): Secondary | ICD-10-CM

## 2015-10-03 DIAGNOSIS — G8929 Other chronic pain: Secondary | ICD-10-CM

## 2015-10-03 DIAGNOSIS — M5432 Sciatica, left side: Secondary | ICD-10-CM

## 2015-10-03 NOTE — Therapy (Signed)
[place] [facility] [address] [place], [address] Phone: [phone]   Fax:  [phone]  Physical Therapy Treatment  Patient Details  Name: Steven Luna MRN: [id number] Date of Birth: 1971/08/19 No Data Recorded  Encounter Date: 10/03/2015      PT End of Session - 10/03/15 0944    Visit Number 18   Number of Visits 17   Date for PT Re-Evaluation 09/29/15   PT Start Time 0815   PT Stop Time 0900   PT Time Calculation (min) 45 min   Activity Tolerance No increased pain;Patient tolerated treatment well   Behavior During Therapy [facility] for tasks assessed/performed      No past medical history on file.  No past surgical history on file.  There were no vitals filed for this visit.  Visit Diagnosis:  Chronic left-sided low back pain with left-sided sciatica  Muscle weakness  Sciatica associated with disorder of lumbar spine, left      Subjective Assessment - 10/03/15 0911    Subjective Pt. has an increased level of pain today in L low back and L lateral LE, as well as tingling in his L foot. Pt reports he is not sure what may have increased his pain   Patient is accompained by: Interpreter   Pertinent History Patient has had a similar experience several years ago, sought PT services and found symptoms resolved for 1.5 years.    Limitations Sitting;Lifting;Standing;Walking;House hold activities   Diagnostic tests Nonenoted.    Patient Stated Goals To reduce pain levels, similar to previous PT encounter.    Pain Score 2    Pain Onset More than a month ago        Objective:   Performed L lateral shift to correct spinal alignment, 2 bouts with repeated extension X10. Pt responded well as seen in proper spinal alignment and report of decreased pain.  STM perform on L lumbar paraspinals to relax musculature and decrease pain. Pt reported further decrease in pain and elimination of L LE symptoms.   Performed grade 3 CPA of L2-L5 3X45 sec. to address Lumbar stiffness and increased lumbar tension. Pt responded well as noted by decreased tension and report of elimination of pain.   Pt education to address upper chest breathing and breath holding                           PT Education - 10/03/15 0941    Education provided Yes   Education Details Educated to perform self STM using tennis ball in supine, and stepping in place while performing abdominal drawing in maneuver    Person(s) Educated Patient   Methods Explanation;Demonstration;Verbal cues             PT Long Term Goals - 10/01/15 0903    PT LONG TERM GOAL #1   Title Patient will be able to march in place 5 times each leg with 0/10 NPRS w/o cueing for continued breathing, to enable pt to move from sit to stand w/o pain.   Baseline 7/10   Time 4   Period Weeks   Status New   PT LONG TERM GOAL #2   Title Patient will be able to perform SLS for 10 sec each leg w/ 0/10 NPRS w/o cueing for continued breathing, to increase symmetrical WB and improve ambulation.   Baseline 7/10   Time 4   Period Weeks   Status  New   PT LONG TERM GOAL #3   Title Patient will report no increase in pain with turning in bed to allow for more comfortable sleep.   Time 4   Period Weeks   Status Achieved   PT LONG TERM GOAL #4   Title Patient will be independent with a HEP to increase his tolerance for ADLs and decrease his symptoms.    Time 4   Period Weeks   Status Not Met               Plan - 10/03/15 1040    Clinical Impression Statement pt is able to make within session improvements; however, he has not benable to maintain any carryover between sessions    Pt will benefit from skilled therapeutic intervention in order to improve on the following deficits Abnormal gait;Pain;Decreased mobility;Decreased strength;Impaired flexibility;Difficulty walking   Rehab Potential Good   Clinical Impairments Affecting  Rehab Potential Pro- quick response, previous successful response, centralization in this session. Cons - long standing chronic pain. High FAB-Q score.    PT Frequency 2x / week   PT Duration 2 weeks   PT Treatment/Interventions Traction;Moist Heat;Iontophoresis 33m/ml Dexamethasone;Electrical Stimulation;Gait training;Biofeedback;Patient/family education;Manual techniques;Therapeutic exercise;Therapeutic activities;Dry needling;Taping   PT Next Visit Plan Assess ability to allign neutral spine in prone, assess standing repeated extensions. Progression of supine core stabilization exercises   PT Home Exercise Plan See patient instructions   Consulted and Agree with Plan of Care Patient        Problem List There are no active problems to display for this patient.   SVinson MoselleRij SPT 10/03/2015, 10:45 AM  BMont DuttonPT DPT  [place] [facility] [address] [place] [address]Phone: [phone]  Fax:  [phone] Name: [name] MRN: [id number] of Birth: 104-02-1971

## 2015-10-08 ENCOUNTER — Ambulatory Visit: Payer: Self-pay | Admitting: Physical Therapy

## 2015-10-08 DIAGNOSIS — M5432 Sciatica, left side: Secondary | ICD-10-CM

## 2015-10-08 DIAGNOSIS — G8929 Other chronic pain: Secondary | ICD-10-CM

## 2015-10-08 DIAGNOSIS — M5442 Lumbago with sciatica, left side: Secondary | ICD-10-CM

## 2015-10-08 NOTE — Therapy (Signed)
[place] [facility] [address] [place], [address] Phone: [phone]   Fax:  [phone]  Physical Therapy Treatment  Patient Details  Name: Steven Luna MRN: [id number] Date of Birth: 05-20-1971 No Data Recorded  Encounter Date: 10/08/2015      PT End of Session - 10/08/15 1009    Visit Number 19   Number of Visits 26   Date for PT Re-Evaluation 09/29/15   PT Start Time 0825   PT Stop Time 0905   PT Time Calculation (min) 40 min   Activity Tolerance No increased pain;Patient tolerated treatment well   Behavior During Therapy [facility] for tasks assessed/performed      No past medical history on file.  No past surgical history on file.  There were no vitals filed for this visit.  Visit Diagnosis:  Sciatica associated with disorder of lumbar spine, left  Chronic left-sided low back pain with left-sided sciatica      Subjective Assessment - 10/08/15 0827    Subjective pt reports increased pain since friday. Pain is currently in L LE down to knee w/ occasional pain down to foot when standing for more than a "little while." Reports self STM w/ sock ball aggravated leg pain.    Patient is accompained by: Interpreter   Pertinent History Patient has had a similar experience several years ago, sought PT services and found symptoms resolved for 1.5 years.    Limitations Sitting;Lifting;Standing;Walking;House hold activities   Diagnostic tests Nonenoted.    Patient Stated Goals To reduce pain levels, similar to previous PT encounter.    Currently in Pain? Yes   Pain Score 5    Pain Location Back   Pain Onset More than a month ago          Objective:  Performed grade 3 PA mobs L2-5 X1, grade 4 PA mobs L2-5 X2. Pt tolerated well, initial set causing pain at L4/5, no pain by third set. L UPA mob done at L2-5 3X45 sec. Noted increase tissue tension at L lumbar paraspinals. Decr. tissue tension/pain post mob.   Prone extensions on hands done 3X10 w/ PT overpressure. Pt reported complete abolishment of pain by 2nd set.  Progressed to standing lateral shift correction 2X30 sec combined w/ extension. Elimination of lateral shift post shift correction.  Marching in place done 10X each leg 3 w/ cues for shoulder depression continued breathing and abdominal breathing. Pt required sit breaks between sets due to increasing pain down to posterior L knee.                         PT Education - 10/08/15 1007    Education provided Yes   Education Details discontinue self STM w/ sock if it aggravates. Perform extensions/lat shift correction followed by walking as long as tollerable    Person(s) Educated Patient   Methods Explanation;Demonstration   Comprehension Verbalized understanding;Returned demonstration             PT Long Term Goals - 10/01/15 0903    PT LONG TERM GOAL #1   Title Patient will be able to march in place 5 times each leg with 0/10 NPRS w/o cueing for continued breathing, to enable pt to move from sit to stand w/o pain.   Baseline 7/10   Time 4   Period Weeks   Status New   PT LONG TERM GOAL #2   Title Patient will be able  to perform SLS for 10 sec each leg w/ 0/10 NPRS w/o cueing for continued breathing, to increase symmetrical WB and improve ambulation.   Baseline 7/10   Time 4   Period Weeks   Status New   PT LONG TERM GOAL #3   Title Patient will report no increase in pain with turning in bed to allow for more comfortable sleep.   Time 4   Period Weeks   Status Achieved   PT LONG TERM GOAL #4   Title Patient will be independent with a HEP to increase his tolerance for ADLs and decrease his symptoms.    Time 4   Period Weeks   Status Not Met               Plan - 10/08/15 1010    Clinical Impression Statement Pt responds well to therapy within session, but has not been able to show consistent improvement between sessions. Provide pt w/ long  term progressive HEP and ensure complete understanding of all components.    Pt will benefit from skilled therapeutic intervention in order to improve on the following deficits Abnormal gait;Pain;Decreased mobility;Decreased strength;Impaired flexibility;Difficulty walking   Rehab Potential Good   Clinical Impairments Affecting Rehab Potential Pro- quick response, previous successful response, centralization in this session. Cons - long standing chronic pain. High FAB-Q score.    PT Frequency 2x / week   PT Duration 2 weeks   PT Treatment/Interventions Traction;Moist Heat;Iontophoresis 60m/ml Dexamethasone;Electrical Stimulation;Gait training;Biofeedback;Patient/family education;Manual techniques;Therapeutic exercise;Therapeutic activities;Dry needling;Taping   PT Next Visit Plan Assess ability to allign neutral spine in prone, assess standing repeated extensions. Progression of supine core stabilization exercises   PT Home Exercise Plan See patient instructions   Consulted and Agree with Plan of Care Patient        Problem List There are no active problems to display for this patient.   SVinson MoselleRij SPT 10/08/2015, 12:06 PM  BMont DuttonPT DPT  [place] [facility] [address] [place] [address]Phone: [phone]  Fax:  [phone] Name: [name] MRN: [id number] of Birth: 102-07-1971

## 2015-10-10 ENCOUNTER — Ambulatory Visit: Payer: Self-pay | Admitting: Physical Therapy

## 2015-10-10 DIAGNOSIS — G8929 Other chronic pain: Secondary | ICD-10-CM

## 2015-10-10 DIAGNOSIS — M5442 Lumbago with sciatica, left side: Principal | ICD-10-CM

## 2015-10-10 NOTE — Therapy (Signed)
[place] [facility] [address], [place], [address] Phone: [phone]   Fax:  [phone]  Physical Therapy Treatment/Discharge  Patient Details  Name: Steven Luna MRN: [id number] Date of Birth: 11/23/1970 No Data Recorded  Encounter Date: 10/10/2015      PT End of Session - 10/10/15 1022    Visit Number 20   Number of Visits 26   Date for PT Re-Evaluation 09/29/15   Activity Tolerance No increased pain;Patient tolerated treatment well   Behavior During Therapy [facility] for tasks assessed/performed      No past medical history on file.  No past surgical history on file.  There were no vitals filed for this visit.  Visit Diagnosis:  Chronic left-sided low back pain with left-sided sciatica      Subjective Assessment - 10/10/15 0928    Subjective Pt reports his pain has been excruciating since Friday w/ slight relief durring last therapy session and through the remainder of that day, progressively has gotten worse in the last two days with pain going down the leg into the foot.    Patient is accompained by: Interpreter   Pertinent History Patient has had a similar experience several years ago, sought PT services and found symptoms resolved for 1.5 years.    Limitations Sitting;Lifting;Standing;Walking;House hold activities   Diagnostic tests Nonenoted.    Patient Stated Goals To reduce pain levels, similar to previous PT encounter.    Currently in Pain? Yes   Pain Score 7    Pain Onset More than a month ago      Objective:    Discussed with patient today's therapy session was to consist of self-care/treatment, being that today's session will be our last session for the time being. Pt acknowledged understanding. Upon request, side lying self-correction for lateral shift was demonstrated by pt w/ proper positioning, pillow under abdomen and head. Pt responded well, as reported pain decreased from 7/10 to  4/10.  Pt performed prone extensions 3X10 with further LBP/LE pain reduction to 2/10.  Marching in place performed 10X each leg, Pt reported pain increase to 3/10 low back/L LE. Pt repositioned in prone to decrease pain. Another set of prone extensions 3X10 w/ pt report of decreased pain to 1/10 in L LE, and 0/10 low back. Repeated extensions followed by 300 ft of walking and no report of increased pain.  Concluded by ensuring complete understanding of all components of HEP consisting of the above treatment. Also, emphasized the importance of continued breathing w/ exercise/movement, and abdominal drawing in exercises.  Pt verbalized and demonstrated proper performance of all HEP components.                            PT Education - 10/10/15 1018    Education provided Yes   Education Details instructed to perfrom self side lying shift correction followed by 3X10 extensions followed by walking, repeat sequence as needed for pain until 45 min of walking can be achieved   Person(s) Educated Patient   Methods Explanation;Demonstration;Tactile cues;Verbal cues   Comprehension Verbalized understanding;Returned demonstration;Verbal cues required             PT Long Term Goals - 10/01/15 0903    PT LONG TERM GOAL #1   Title Patient will be able to march in place 5 times each leg with 0/10 NPRS w/o cueing for continued breathing, to enable pt to move from sit  to stand w/o pain.   Baseline 7/10   Time 4   Period Weeks   Status New   PT LONG TERM GOAL #2   Title Patient will be able to perform SLS for 10 sec each leg w/ 0/10 NPRS w/o cueing for continued breathing, to increase symmetrical WB and improve ambulation.   Baseline 7/10   Time 4   Period Weeks   Status New   PT LONG TERM GOAL #3   Title Patient will report no increase in pain with turning in bed to allow for more comfortable sleep.   Time 4   Period Weeks   Status Achieved   PT LONG TERM GOAL #4   Title  Patient will be independent with a HEP to increase his tolerance for ADLs and decrease his symptoms.    Time 4   Period Weeks   Status Not Met               Plan - 10/10/15 1022    Clinical Impression Statement Pt presents with lateral lumbar shift and persistent LBP/L LE pain that responds well to within session therapy. Pt has not been able to maintain improvements between sessions and appears to have plateaued in treatment. Detailed HEP provided to aide pt in self pain management.    Pt will benefit from skilled therapeutic intervention in order to improve on the following deficits Abnormal gait;Pain;Decreased mobility;Decreased strength;Impaired flexibility;Difficulty walking   Rehab Potential Good   Clinical Impairments Affecting Rehab Potential Pro- quick response, previous successful response, centralization in this session. Cons - long standing chronic pain. High FAB-Q score.    PT Frequency 2x / week   PT Duration 2 weeks   PT Treatment/Interventions Traction;Moist Heat;Iontophoresis 36m/ml Dexamethasone;Electrical Stimulation;Gait training;Biofeedback;Patient/family education;Manual techniques;Therapeutic exercise;Therapeutic activities;Dry needling;Taping   PT Next Visit Plan Assess ability to allign neutral spine in prone, assess standing repeated extensions. Progression of supine core stabilization exercises   PT Home Exercise Plan See patient instructions   Consulted and Agree with Plan of Care Patient        Problem List There are no active problems to display for this patient.   SVinson MoselleRij SPT 10/10/2015, 11:03 AM  BMont DuttonPT DPT  [place] [facility] [address] [place] [address]Phone: [phone]  Fax:  [phone] Name: Steven Luna MRN: [id number] of Birth: 112/22/1972

## 2016-06-29 ENCOUNTER — Encounter (INDEPENDENT_AMBULATORY_CARE_PROVIDER_SITE_OTHER): Payer: Self-pay

## 2016-06-29 ENCOUNTER — Ambulatory Visit: Payer: Self-pay | Attending: Pain Medicine | Admitting: Pain Medicine

## 2016-06-29 ENCOUNTER — Encounter: Payer: Self-pay | Admitting: Pain Medicine

## 2016-06-29 DIAGNOSIS — Z79891 Long term (current) use of opiate analgesic: Secondary | ICD-10-CM | POA: Insufficient documentation

## 2016-06-29 DIAGNOSIS — M1288 Other specific arthropathies, not elsewhere classified, other specified site: Secondary | ICD-10-CM

## 2016-06-29 DIAGNOSIS — M79605 Pain in left leg: Secondary | ICD-10-CM | POA: Insufficient documentation

## 2016-06-29 DIAGNOSIS — G8929 Other chronic pain: Secondary | ICD-10-CM | POA: Insufficient documentation

## 2016-06-29 DIAGNOSIS — M545 Low back pain, unspecified: Secondary | ICD-10-CM

## 2016-06-29 DIAGNOSIS — F1190 Opioid use, unspecified, uncomplicated: Secondary | ICD-10-CM

## 2016-06-29 DIAGNOSIS — M5136 Other intervertebral disc degeneration, lumbar region: Secondary | ICD-10-CM | POA: Insufficient documentation

## 2016-06-29 DIAGNOSIS — M47816 Spondylosis without myelopathy or radiculopathy, lumbar region: Secondary | ICD-10-CM | POA: Insufficient documentation

## 2016-06-29 HISTORY — DX: Opioid use, unspecified, uncomplicated: F11.90

## 2016-06-29 NOTE — Progress Notes (Signed)
Safety precautions to be maintained throughout the outpatient stay will include: orient to surroundings, keep bed in low position, maintain call bell within reach at all times, provide assistance with transfer out of bed and ambulation.  Pt two percocet pills in his bottle of 5/325 mg.

## 2016-06-29 NOTE — Progress Notes (Signed)
Patient's Name: Steven Luna  MRN: [id number]  Referring Provider: Merri Ray, MD  DOB: 17-Nov-1970  PCP: Merri Ray, MD  DOS: 06/29/2016  Note by: Sydnee Levans. Laban Emperor, MD  Service setting: Ambulatory outpatient  Specialty: Interventional Pain Management  Location: [facility] (AMB) Pain Management Facility    Patient type: New Patient   Primary Reason(s) for Visit: Initial Patient Evaluation CC: Back Pain (low) and Leg Pain (lef tleg to back of calf)  HPI  Steven Luna is a 45 y.o. year old, male patient, who comes today for an initial evaluation. He has Chronic pain; Long term current use of opiate analgesic; Long term prescription opiate use; Opiate use (22.5 MME/Day); Chronic low back pain (Location of Primary Source of Pain) (Bilateral) (L>R); Lumbar facet syndrome (Bilateral) (L>R); and Chronic lower extremity pain (Location of Secondary source of pain) (referred pain) (Left) on his problem list.. His primarily concern today is the Back Pain (low) and Leg Pain (lef tleg to back of calf)  Pain Assessment: Self-Reported Pain Score: 4 /10             Reported level is compatible with observation.       Pain Type: Chronic pain Pain Location: Back Pain Orientation: Lower Pain Descriptors / Indicators: Constant, Pressure Pain Frequency: Constant  Onset and Duration: Sudden and Date of onset: 4 years ago. Cause of pain: Unknown Severity: Getting better, NAS-11 at its worse: 5/10, NAS-11 at its best: 2/10, NAS-11 now: 4/10 and NAS-11 on the average: 4/10 Timing: Morning, Afternoon and Night Aggravating Factors: Bending, Lifiting, Motion, Prolonged sitting, Twisting, Walking, Walking uphill and Walking downhill Alleviating Factors: Medications, Resting and Sleeping Associated Problems: Pain that wakes patient up and Pain that does not allow patient to sleep Quality of Pain: Aching and Tingling Previous Examinations or Tests: MRI scan and X-rays Previous Treatments: Narcotic  medications  The patient comes into the clinics today for the first time for a chronic pain management evaluation. The patient indicates that he's pain started arise solidly approximately 4 years ago. It is 7 the lower back and going down the left lower extremity with the lower back pain being worse than the leg pain. The patient had a positive respond with hyperextension and rotation suggesting bilateral lumbar facet disease with the left being worst than the right. The patient was negative for SI joint or hip pain on Patrick's maneuver. He was informed of her we have to offer and he indicated not being ready for any interventional therapies or surgeries at this point. He is just interested in getting some pain medication.  Today I took the time to provide the patient with information regarding my pain practice. The patient was informed that my practice is divided into two sections: an interventional pain management section, as well as a completely separate and distinct medication management section. The interventional portion of my practice takes place on Tuesdays and Thursdays, while the medication management is conducted on Mondays and Wednesdays. Because of the amount of documentation required on both them, they are kept separated. This means that there is the possibility that the patient may be scheduled for a procedure on Tuesday, while also having a medication management appointment on Wednesday. I have also informed the patient that because of current staffing and facility limitations, I no longer take patients for medication management only. To illustrate the reasons for this, I gave the patient the example of a surgeon and how inappropriate it would be to refer a patient to his/her  practice so that they write for the post-procedure antibiotics on a surgery done by someone else.   The patient was informed that joining my practice means that they are open to any and all interventional therapies. I  clarified for the patient that this does not mean that they will be forced to have any procedures done. What it means is that patients looking for a practitioner to simply write for their pain medications and not take advantage of other interventional techniques will be better served by a different practitioner, other than myself. I made it clear that I prefer to spend my time providing those services that I specialize in.  The patient was also made aware of my Comprehensive Pain Management Safety Guidelines where by joining my practice, they limit all of their nerve blocks and joint injections to those done by our practice, for as long as we are retained to manage their care.   Historic Controlled Substance Pharmacotherapy Review  Previously Prescribed Controlled Substances: Oxycodone/APAP 5/325 one tablet every 6 hours (20  Mg/day of oxycodone) Highest Historical Analgesic Regimen: Oxycodone/APAP 5/325 one every 4 hours (30 mg/day of oxycodone) Currently Prescribed Analgesic: Oxycodone/APAP 5/325 one 3 times a day (15 mg/day of oxycodone) Highest Historical MME/day: 45 mg/day MME/day: 22.5 mg/day Medications: The patient did not bring the medication(s) to the appointment, as requested in our "New Patient Package" Pharmacodynamics: Analgesic Effect: More than 50% Activity Facilitation: Medication(s) allow patient to sit, stand, walk, and do the basic ADLs Perceived Effectiveness: Described as relatively effective, allowing for increase in activities of daily living (ADL) Side-effects or Adverse reactions: None reported Historical Background Evaluation: [place] PDMP: Five (5) year initial data search conducted. Regular monthly use of opioid narcotics, nonstop, since 05/07/2012. 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 Initial evaluation. The  patient has not received any medications from our practice Treatment compliance: Not applicable. Initial evaluation Risk Assessment Profile: Aberrant/High Risk Behavior: None observed or detected today Risk Factors for Fatal Opioid Overdose: None identified today Fatal overdose hazard ratio (HR): Calculation deferred Non-fatal overdose hazard ratio (HR): Calculation deferred Substance Use Disorder (SUD) Risk Level: Pending results of Medical Psychology Evaluation for SUD Opioid Risk Tool (ORT) Total Score: 0 ORT Score Interpretation:  Score <3 = Low Risk for SUD  Score between 4-7 = Moderate Risk for SUD  Score >8 = High Risk for Opioid Abuse   Pharmacologic Plan: Pending ordered tests and/or consults The patient  has no drug history on file.Marland Kitchen Historical Illicit Drug Screen Labs(s): No results found for: MDMA, COCAINSCRNUR, PCPSCRNUR, THCU, ETH Meds  The patient has a current medication list which includes the following prescription(s): oxycodone-acetaminophen and oxycodone.  Current Outpatient Prescriptions on File Prior to Visit  Medication Sig  . oxyCODONE (ROXICODONE) 15 MG immediate release tablet Take 15 mg by mouth every 4 (four) hours as needed for pain.   No current facility-administered medications on file prior to visit.    Imaging Review  Lumbosacral Imaging: Lumbar MR wo contrast:  Results for orders placed in visit on 07/09/12  MR L Spine Ltd W/O Cm   Narrative * PRIOR REPORT IMPORTED FROM AN EXTERNAL SYSTEM *   PRIOR REPORT IMPORTED FROM THE [facility] WORKFLOW SYSTEM   REASON FOR EXAM:    Low Back Pain  COMMENTS:   PROCEDURE:     MR  - MR LUMBAR SPINE WO CONTRAST  -  Jul 09 2012 10:20AM   RESULT:   Technique: Multiplanar and multisequence imaging of the lumbar spine was  obtained without the administration of gadolinium.   The conus medullaris terminates at an L1-L2 level. The cauda equina  demonstrate no evidence of clumping nor thickening.   At the T12-L1,  L1-L2, L2-L3 levels, there is no evidence of thecal sac  stenosis nor neural foraminal narrowing.   A broad-based annular disc bulge is appreciated at the L3-L4 disc space  level causing partial effacement of the anterior CSF space with resulting  mild to moderate thecal sac narrowing. There is no evidence of neural  foraminal narrowing appreciated at this level.   At the L4-L5 disc space level a broad-based disc bulge is appreciated with  focal protrusion. This causes mass effect upon the central aspect of the  thecal sac with resulting severe thecal sac stenosis. There is a component  of lateralization of the disc bulge to the left with resulting mild neural  foraminal narrowing without evidence of exiting nerve root compression nor  compromise. There is no evidence of neural foraminal narrowing on the  right.   At the L5-S1 level a mild focal disc protrusion is appreciated centrally.  There is encroachment upon the thecal sac without significant thecal sac  narrowing. The neural foramen at this level appears patent.   Evaluation of the osseous structures demonstrates no evidence of  significant  marrow edema. There is desiccation of the L4-L5 disc.   IMPRESSION:   1. 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  2. Areas of degenerative disc disease changes at L3-4 and L5-S1 as  described  above.   Thank you for the opportunity to contribute to the care of your patient.       Lumbar DG 2-3 views:  Results for orders placed in visit on 05/06/12  DG Lumbar Spine 2-3 Views   Narrative * PRIOR REPORT IMPORTED FROM AN EXTERNAL SYSTEM *   PRIOR REPORT IMPORTED FROM THE [facility] WORKFLOW SYSTEM   REASON FOR EXAM:    atraumatic lumbar back pain, now with symptoms of  sciatica in left leg  COMMENTS:   PROCEDURE:     DXR - DXR LUMBAR SPINE AP AND LATERAL  - May 06 2012   5:22PM   RESULT:     Degenerative  changes noted of the lumbar spine. Pedicles are  intact.   IMPRESSION:      No acute abnormality.       Note: Imaging results reviewed.  ROS  Cardiovascular History: Negative for hypertension, coronary artery diseas, myocardial infraction, anticoagulant therapy or heart failure Pulmonary or Respiratory History: Negative for bronchial asthma, emphysema, chronic smoking, chronic bronchitis, sarcoidosis, tuberculosis or sleep apena Neurological History: Negative for epilepsy, stroke, urinary or fecal inontinence, spina bifida or tethered cord syndrome Review of Past Neurological Studies: No results found for this or any previous visit. Psychological-Psychiatric History: Negative for anxiety, depression, schizophrenia, bipolar disorders or suicidal ideations or attempts Gastrointestinal History: Negative for peptic ulcer disease, hiatal hernia, GERD, IBS, hepatitis, cirrhosis or pancreatitis Genitourinary History: Negative for nephrolithiasis, hematuria, renal failure or chronic kidney disease Hematological History: Negative for anticoagulant therapy, anemia, bruising or bleeding easily, hemophilia, sickle cell disease or trait, thrombocytopenia or coagulupathies Endocrine History: Negative for diabetes or thyroid disease Rheumatologic History: Negative for lupus, osteoarthritis, rheumatoid arthritis, myositis, polymyositis or fibromyagia Musculoskeletal History: Negative for myasthenia gravis, muscular dystrophy, multiple  sclerosis or malignant hyperthermia Work History: Out of work due to pain for the past 2 years.  Allergies  Steven Luna has No Known Allergies.  Laboratory Chemistry  Inflammation Markers No results found for: ESRSEDRATE, CRP Renal Function No results found for: BUN, CREATININE, GFRAA, GFRNONAA Hepatic Function No results found for: AST, ALT, ALBUMIN Electrolytes No results found for: NA, K, CL, CALCIUM, MG Pain Modulating Vitamins No results found for: VD25OH,  [id number], [id number], [id number], 25OHVITD1, 25OHVITD2, 25OHVITD3, VITAMINB12 Coagulation Parameters No results found for: INR, LABPROT, APTT, PLT Cardiovascular No results found for: BNP, HGB, HCT Note: Lab results reviewed.  PFSH  Drug: Steven Luna  has no drug history on file. Alcohol:  reports that he does not drink alcohol. Tobacco:  reports that he has never smoked. He does not have any smokeless tobacco history on file. Medical:  has a past medical history of Opiate use (22.5 MME/Day) (06/29/2016). Family: family history is not on file.  No past surgical history on file. Active Ambulatory Problems    Diagnosis Date Noted  . Chronic pain 06/29/2016  . Long term current use of opiate analgesic 06/29/2016  . Long term prescription opiate use 06/29/2016  . Opiate use (22.5 MME/Day) 06/29/2016  . Chronic low back pain (Location of Primary Source of Pain) (Bilateral) (L>R) 06/29/2016  . Lumbar facet syndrome (Bilateral) (L>R) 06/29/2016  . Chronic lower extremity pain (Location of Secondary source of pain) (referred pain) (Left) 06/29/2016   Resolved Ambulatory Problems    Diagnosis Date Noted  . No Resolved Ambulatory Problems   Past Medical History:  Diagnosis Date  . Opiate use (22.5 MME/Day) 06/29/2016   Constitutional Exam  General appearance: Well nourished, well developed, and well hydrated. In no apparent acute distress Vitals:   06/29/16 0800  BP: (!) 148/86  Pulse: 98  Resp: 16  Temp: 98.8 F (37.1 C)  TempSrc: Oral  SpO2: 100%  Weight: 189 lb (85.7 kg)  Height: 5\' 7"  (1.702 m)   BMI Assessment: Estimated body mass index is 29.6 kg/m as calculated from the following:   Height as of this encounter: 5\' 7"  (1.702 m).   Weight as of this encounter: 189 lb (85.7 kg).  BMI interpretation table: BMI level Category Range association with higher incidence of chronic pain  <18 kg/m2 Underweight   18.5-24.9 kg/m2 Ideal body weight   25-29.9 kg/m2 Overweight  Increased incidence by 20%  30-34.9 kg/m2 Obese (Class I) Increased incidence by 68%  35-39.9 kg/m2 Severe obesity (Class II) Increased incidence by 136%  >40 kg/m2 Extreme obesity (Class III) Increased incidence by 254%   BMI Readings from Last 4 Encounters:  06/29/16 29.60 kg/m   Wt Readings from Last 4 Encounters:  06/29/16 189 lb (85.7 kg)  Psych/Mental status: Alert, oriented x 3 (person, place, & time) Eyes: PERLA Respiratory: No evidence of acute respiratory distress  Cervical Spine Exam  Inspection: No masses, redness, or swelling Alignment: Symmetrical Functional ROM: Unrestricted ROM Stability: No instability detected Muscle strength & Tone: Functionally intact Sensory: Unimpaired Palpation: Non-contributory  Upper Extremity (UE) Exam    Side: Right upper extremity  Side: Left upper extremity  Inspection: No masses, redness, swelling, or asymmetry  Inspection: No masses, redness, swelling, or asymmetry  Functional ROM: Unrestricted ROM         Functional ROM: Unrestricted ROM          Muscle strength & Tone: Functionally intact  Muscle strength & Tone: Functionally intact  Sensory: Unimpaired  Sensory:  Unimpaired  Palpation: Non-contributory  Palpation: Non-contributory   Thoracic Spine Exam  Inspection: No masses, redness, or swelling Alignment: Symmetrical Functional ROM: Unrestricted ROM Stability: No instability detected Sensory: Unimpaired Muscle strength & Tone: Functionally intact Palpation: Non-contributory  Lumbar Spine Exam  Inspection: No masses, redness, or swelling Alignment: Symmetrical Functional ROM: Decreased ROM Stability: No instability detected Muscle strength & Tone: Functionally intact Sensory: Movement-associated pain Palpation: Complains of area being tender to palpation Provocative Tests: Lumbar Hyperextension and rotation test: Positive bilaterally for facet joint pain. Patrick's Maneuver: Negative              Gait & Posture  Assessment  Ambulation: Unassisted Gait: Relatively normal for age and body habitus Posture: WNL   Lower Extremity Exam    Side: Right lower extremity  Side: Left lower extremity  Inspection: No masses, redness, swelling, or asymmetry  Inspection: No masses, redness, swelling, or asymmetry  Functional ROM: Unrestricted ROM          Functional ROM: Unrestricted ROM          Muscle strength & Tone: Functionally intact  Muscle strength & Tone: Functionally intact  Sensory: Unimpaired  Sensory: Unimpaired  Palpation: Non-contributory  Palpation: Non-contributory   Assessment  Primary Diagnosis & Pertinent Problem List: Diagnoses of Chronic pain, Long term current use of opiate analgesic, Long term prescription opiate use, Opiate use (22.5 MME/Day), Chronic low back pain (Location of Primary Source of Pain) (Bilateral) (L>R), Lumbar facet syndrome (Bilateral) (L>R), and Chronic lower extremity pain (Location of Secondary source of pain) (referred pain) (Left) were pertinent to this visit.  Visit Diagnosis: 1. Chronic pain   2. Long term current use of opiate analgesic   3. Long term prescription opiate use   4. Opiate use (22.5 MME/Day)   5. Chronic low back pain (Location of Primary Source of Pain) (Bilateral) (L>R)   6. Lumbar facet syndrome (Bilateral) (L>R)   7. Chronic lower extremity pain (Location of Secondary source of pain) (referred pain) (Left)    Plan of Care  Initial Treatment Plan:  Please be advised that as per protocol, today's visit has been an evaluation only. We have not taken over the patient's controlled substance management. The patient is not interested in any interventional therapies and were not taking patients for medication management only. Therefore, the patient was instructed to go back to Dr. Yves Dill for further management.  Problem-Specific Plan: No problem-specific Assessment & Plan notes found for this encounter.  Ordered Lab-work, Procedure(s),  Referral(s), & Consult(s): No orders of the defined types were placed in this encounter.  Pharmacotherapy: Medications ordered:  No orders of the defined types were placed in this encounter.  Medications administered during this visit: Steven Luna had no medications administered during this visit.   Pharmacotherapy under consideration:  Opioid Analgesics: We will not be taking his case for medication management since he is not interested in any interventional therapies.    Interventional therapies under consideration: The patient indicated not being interested in any interventional therapies at this time.    Requested PM Follow-up: Return for No return (D/C).  No future appointments. Patient instructed to go go back to Dr. Merri Ray for further management.  Primary Care Physician: Merri Ray, MD Location: [facility] Note by: Sydnee Levans. Laban Emperor, M.D, DABA, DABAPM, DABPM, DABIPP, FIPP  Pain Score Disclaimer: We use the NRS-11 scale. This is a self-reported, subjective measurement of pain severity with only modest accuracy. It is used primarily  to identify changes within a particular patient. It must be understood that outpatient pain scales are significantly less accurate that those used for research, where they can be applied under ideal controlled circumstances with minimal exposure to variables. In reality, the score is likely to be a combination of pain intensity and pain affect, where pain affect describes the degree of emotional arousal or changes in action readiness caused by the sensory experience of pain. Factors such as social and work situation, setting, emotional state, anxiety levels, expectation, and prior pain experience may influence pain perception and show large inter-individual differences that may also be affected by time variables.  Patient instructions provided during this appointment: There are no Patient Instructions on  file for this visit.

## 2016-06-29 NOTE — Progress Notes (Signed)
Safety precautions to be maintained throughout the outpatient stay will include: orient to surroundings, keep bed in low position, maintain call bell within reach at all times, provide assistance with transfer out of bed and ambulation. Pt has two percocet pills in his bottle of 5/325mg 

## 2017-02-23 ENCOUNTER — Ambulatory Visit: Payer: Self-pay | Admitting: Adult Health Nurse Practitioner

## 2017-02-23 VITALS — BP 125/80 | HR 91 | Temp 98.5°F | Wt 205.7 lb

## 2017-02-23 DIAGNOSIS — Z0000 Encounter for general adult medical examination without abnormal findings: Secondary | ICD-10-CM | POA: Insufficient documentation

## 2017-02-23 DIAGNOSIS — M5440 Lumbago with sciatica, unspecified side: Secondary | ICD-10-CM

## 2017-02-23 HISTORY — DX: Lumbago with sciatica, unspecified side: M54.40

## 2017-02-23 NOTE — Progress Notes (Signed)
   Subjective:    Patient ID: Steven Luna, male    DOB: 23-Nov-1970, 46 y.o.   MRN: [id number]  HPI  Pt here for back pain. Reports that pain began in low back and is now in both of his knees. Pain is a 4. Pt has been prescribed oxyCODONE and reports taking 2 in the morning and 1 in the afternoon   Patient Active Problem List   Diagnosis Date Noted  . Chronic pain 06/29/2016  . Long term current use of opiate analgesic 06/29/2016  . Long term prescription opiate use 06/29/2016  . Opiate use (22.5 MME/Day) 06/29/2016  . Chronic low back pain (Location of Primary Source of Pain) (Bilateral) (L>R) 06/29/2016  . Lumbar facet syndrome (Bilateral) (L>R) 06/29/2016  . Chronic lower extremity pain (Location of Secondary source of pain) (referred pain) (Left) 06/29/2016   Allergies as of 02/23/2017   No Known Allergies     Medication List       Accurate as of 02/23/17  6:09 PM. Always use your most recent med list.          oxyCODONE-acetaminophen 5-325 MG tablet Commonly known as:  PERCOCET/ROXICET 1 po tid prn Earliest Fill Date: 06/20/16        Review of Systems  All other systems reviewed and are negative.      Objective:   Physical Exam  Constitutional: He is oriented to person, place, and time. He appears well-developed and well-nourished.  HENT:  Head: Normocephalic and atraumatic.  Neck: Neck supple. No thyromegaly present.  Cardiovascular: Normal rate, regular rhythm and normal heart sounds.   Pulmonary/Chest: Effort normal and breath sounds normal.  Abdominal: Soft. Bowel sounds are normal.  Neurological: He is alert and oriented to person, place, and time.    BP 125/80   Pulse 91   Temp 98.5 F (36.9 C) (Oral)   Wt 205 lb 11.2 oz (93.3 kg)   BMI 32.22 kg/m   No swelling in feet.      Assessment & Plan:  Recommended referral to [facility] chiropractor for back pain and ice and heat and take OTC pain control and routine labs.   Labs today: CBC, MET C,  Lipids, TSH, PSA, A1C Referral for xray of lumbar spine for back pain F/u: PRN

## 2017-03-03 ENCOUNTER — Ambulatory Visit: Payer: Self-pay | Admitting: Chiropractor

## 2017-03-08 ENCOUNTER — Ambulatory Visit: Payer: Self-pay

## 2020-05-30 ENCOUNTER — Inpatient Hospital Stay
Admission: EM | Admit: 2020-05-30 | Discharge: 2020-06-07 | DRG: 871 | Disposition: A | Discharge status: Home / Self Care | Payer: Medicaid Other | Attending: Internal Medicine | Admitting: Internal Medicine

## 2020-05-30 ENCOUNTER — Other Ambulatory Visit: Payer: Self-pay

## 2020-05-30 ENCOUNTER — Emergency Department: Payer: Medicaid Other

## 2020-05-30 ENCOUNTER — Encounter: Payer: Self-pay | Admitting: Intensive Care

## 2020-05-30 DIAGNOSIS — R197 Diarrhea, unspecified: Secondary | ICD-10-CM | POA: Diagnosis present

## 2020-05-30 DIAGNOSIS — J9600 Acute respiratory failure, unspecified whether with hypoxia or hypercapnia: Secondary | ICD-10-CM | POA: Diagnosis present

## 2020-05-30 DIAGNOSIS — G8929 Other chronic pain: Secondary | ICD-10-CM | POA: Diagnosis present

## 2020-05-30 DIAGNOSIS — J9601 Acute respiratory failure with hypoxia: Secondary | ICD-10-CM | POA: Diagnosis present

## 2020-05-30 DIAGNOSIS — Z79891 Long term (current) use of opiate analgesic: Secondary | ICD-10-CM

## 2020-05-30 DIAGNOSIS — U071 COVID-19: Secondary | ICD-10-CM

## 2020-05-30 DIAGNOSIS — G894 Chronic pain syndrome: Secondary | ICD-10-CM | POA: Diagnosis present

## 2020-05-30 DIAGNOSIS — M545 Low back pain, unspecified: Secondary | ICD-10-CM | POA: Diagnosis present

## 2020-05-30 DIAGNOSIS — J1282 Pneumonia due to coronavirus disease 2019: Secondary | ICD-10-CM | POA: Diagnosis present

## 2020-05-30 DIAGNOSIS — E119 Type 2 diabetes mellitus without complications: Secondary | ICD-10-CM | POA: Diagnosis not present

## 2020-05-30 DIAGNOSIS — A4189 Other specified sepsis: Principal | ICD-10-CM | POA: Diagnosis present

## 2020-05-30 DIAGNOSIS — Z794 Long term (current) use of insulin: Secondary | ICD-10-CM | POA: Diagnosis not present

## 2020-05-30 DIAGNOSIS — R6520 Severe sepsis without septic shock: Secondary | ICD-10-CM

## 2020-05-30 DIAGNOSIS — E1165 Type 2 diabetes mellitus with hyperglycemia: Secondary | ICD-10-CM | POA: Diagnosis not present

## 2020-05-30 DIAGNOSIS — A419 Sepsis, unspecified organism: Secondary | ICD-10-CM

## 2020-05-30 LAB — TROPONIN I (HIGH SENSITIVITY)
Troponin I (High Sensitivity): 10 ng/L (ref <18)
Troponin I (High Sensitivity): 11 ng/L (ref <18)

## 2020-05-30 LAB — BASIC METABOLIC PANEL
Anion gap: 14 (ref 5–15)
BUN: 11 mg/dL (ref 6–20)
CO2: 23 mmol/L (ref 22–32)
Calcium: 8 mg/dL — ABNORMAL LOW (ref 8.9–10.3)
Chloride: 96 mmol/L — ABNORMAL LOW (ref 98–111)
Creatinine, Ser: 0.71 mg/dL (ref 0.61–1.24)
GFR calc Af Amer: >60 mL/min (ref >60)
GFR calc non Af Amer: >60 mL/min (ref >60)
Glucose, Bld: 208 mg/dL — ABNORMAL HIGH (ref 70–99)
Potassium: 4.2 mmol/L (ref 3.5–5.1)
Sodium: 133 mmol/L — ABNORMAL LOW (ref 135–145)

## 2020-05-30 LAB — PROCALCITONIN: Procalcitonin: 0.3 ng/mL

## 2020-05-30 LAB — CBC
HCT: 46.4 % (ref 39.0–52.0)
HCT: 47.9 % (ref 39.0–52.0)
Hemoglobin: 16.3 g/dL (ref 13.0–17.0)
Hemoglobin: 16.8 g/dL (ref 13.0–17.0)
MCH: 33.3 pg (ref 26.0–34.0)
MCH: 33.6 pg (ref 26.0–34.0)
MCHC: 35.1 g/dL (ref 30.0–36.0)
MCHC: 35.1 g/dL (ref 30.0–36.0)
MCV: 94.9 fL (ref 80.0–100.0)
MCV: 95.8 fL (ref 80.0–100.0)
Platelets: 201 10*3/uL (ref 150–400)
Platelets: 222 10*3/uL (ref 150–400)
RBC: 4.89 MIL/uL (ref 4.22–5.81)
RBC: 5 MIL/uL (ref 4.22–5.81)
RDW: 11.9 % (ref 11.5–15.5)
RDW: 11.9 % (ref 11.5–15.5)
WBC: 7 10*3/uL (ref 4.0–10.5)
WBC: 7.7 10*3/uL (ref 4.0–10.5)
nRBC: 0 % (ref 0.0–0.2)
nRBC: 0 % (ref 0.0–0.2)

## 2020-05-30 LAB — HIV ANTIBODY (ROUTINE TESTING W REFLEX): HIV Screen 4th Generation wRfx: NONREACTIVE

## 2020-05-30 LAB — TRIGLYCERIDES: Triglycerides: 175 mg/dL — ABNORMAL HIGH (ref <150)

## 2020-05-30 LAB — LACTATE DEHYDROGENASE: LDH: 426 U/L — ABNORMAL HIGH (ref 98–192)

## 2020-05-30 LAB — FERRITIN: Ferritin: 2036 ng/mL — ABNORMAL HIGH (ref 24–336)

## 2020-05-30 LAB — FIBRINOGEN: Fibrinogen: >750 mg/dL — ABNORMAL HIGH (ref 210–475)

## 2020-05-30 LAB — FIBRIN DERIVATIVES D-DIMER (ARMC ONLY): Fibrin derivatives D-dimer (ARMC): 1537.88 ng/mL (FEU) — ABNORMAL HIGH (ref 0.00–499.00)

## 2020-05-30 LAB — CREATININE, SERUM
Creatinine, Ser: 0.75 mg/dL (ref 0.61–1.24)
GFR calc Af Amer: >60 mL/min (ref >60)
GFR calc non Af Amer: >60 mL/min (ref >60)

## 2020-05-30 LAB — C-REACTIVE PROTEIN: CRP: 27.9 mg/dL — ABNORMAL HIGH (ref <1.0)

## 2020-05-30 LAB — RESPIRATORY PANEL BY RT PCR (FLU A&B, COVID)
Influenza A by PCR: NEGATIVE
Influenza B by PCR: NEGATIVE
SARS Coronavirus 2 by RT PCR: POSITIVE — AB

## 2020-05-30 LAB — LACTIC ACID, PLASMA: Lactic Acid, Venous: 2.4 mmol/L (ref 0.5–1.9)

## 2020-05-30 MED ORDER — GUAIFENESIN-DM 100-10 MG/5ML PO SYRP
10.0000 mL | ORAL_SOLUTION | ORAL | Status: DC | PRN
  Filled 2020-05-30: qty 10

## 2020-05-30 MED ORDER — ONDANSETRON HCL 4 MG PO TABS
4.0000 mg | ORAL_TABLET | Freq: Four times a day (QID) | ORAL | Status: DC | PRN
  Administered 2020-05-31: 4 mg via ORAL
  Filled 2020-05-30 (×2): qty 1

## 2020-05-30 MED ORDER — SODIUM CHLORIDE 0.9 % IV SOLN
200.0000 mg | Freq: Once | INTRAVENOUS | Status: AC
  Administered 2020-05-30: 200 mg via INTRAVENOUS
  Filled 2020-05-30: qty 200

## 2020-05-30 MED ORDER — ASCORBIC ACID 500 MG PO TABS
500.0000 mg | ORAL_TABLET | Freq: Every day | ORAL | Status: DC
  Administered 2020-05-30 – 2020-06-07 (×9): 500 mg via ORAL
  Filled 2020-05-30 (×10): qty 1

## 2020-05-30 MED ORDER — ENOXAPARIN SODIUM 40 MG/0.4ML ~~LOC~~ SOLN: 40.0000 mg | SUBCUTANEOUS | Status: DC

## 2020-05-30 MED ORDER — SODIUM CHLORIDE 0.9 % IV SOLN
2.0000 g | Freq: Once | INTRAVENOUS | Status: AC
  Administered 2020-05-30: 2 g via INTRAVENOUS
  Filled 2020-05-30: qty 20

## 2020-05-30 MED ORDER — METHYLPREDNISOLONE SODIUM SUCC 125 MG IJ SOLR: 0.5000 mg/kg | Freq: Two times a day (BID) | INTRAMUSCULAR | Status: DC

## 2020-05-30 MED ORDER — SODIUM CHLORIDE 0.9% FLUSH
3.0000 mL | Freq: Two times a day (BID) | INTRAVENOUS | Status: DC
  Administered 2020-05-30 – 2020-06-07 (×15): 3 mL via INTRAVENOUS

## 2020-05-30 MED ORDER — METHYLPREDNISOLONE SODIUM SUCC 125 MG IJ SOLR
0.5000 mg/kg | Freq: Two times a day (BID) | INTRAMUSCULAR | Status: AC
  Administered 2020-05-31 – 2020-06-02 (×6): 41.875 mg via INTRAVENOUS
  Filled 2020-05-30 (×6): qty 2

## 2020-05-30 MED ORDER — PREDNISONE 50 MG PO TABS
50.0000 mg | ORAL_TABLET | Freq: Every day | ORAL | Status: DC
  Administered 2020-06-03 – 2020-06-07 (×5): 50 mg via ORAL
  Filled 2020-05-30 (×5): qty 1

## 2020-05-30 MED ORDER — PREDNISONE 20 MG PO TABS: 50.0000 mg | ORAL_TABLET | Freq: Every day | ORAL | Status: DC

## 2020-05-30 MED ORDER — ONDANSETRON HCL 4 MG/2ML IJ SOLN
4.0000 mg | Freq: Four times a day (QID) | INTRAMUSCULAR | Status: DC | PRN
  Administered 2020-06-02: 4 mg via INTRAVENOUS
  Filled 2020-05-30: qty 2

## 2020-05-30 MED ORDER — ACETAMINOPHEN 325 MG PO TABS
650.0000 mg | ORAL_TABLET | Freq: Four times a day (QID) | ORAL | Status: DC | PRN
  Administered 2020-05-30 – 2020-05-31 (×2): 650 mg via ORAL
  Filled 2020-05-30 (×2): qty 2

## 2020-05-30 MED ORDER — SODIUM CHLORIDE 0.9 % IV SOLN
100.0000 mg | Freq: Every day | INTRAVENOUS | Status: AC
  Administered 2020-05-31 – 2020-06-03 (×4): 100 mg via INTRAVENOUS
  Filled 2020-05-30 (×3): qty 100
  Filled 2020-05-30: qty 20

## 2020-05-30 MED ORDER — HYDROCOD POLST-CPM POLST ER 10-8 MG/5ML PO SUER
5.0000 mL | Freq: Two times a day (BID) | ORAL | Status: DC | PRN
  Administered 2020-05-31: 5 mL via ORAL
  Filled 2020-05-30: qty 5

## 2020-05-30 MED ORDER — ZINC SULFATE 220 (50 ZN) MG PO CAPS
220.0000 mg | ORAL_CAPSULE | Freq: Every day | ORAL | Status: DC
  Administered 2020-05-30 – 2020-06-07 (×9): 220 mg via ORAL
  Filled 2020-05-30 (×9): qty 1

## 2020-05-30 MED ORDER — SODIUM CHLORIDE 0.9 % IV SOLN: 100.0000 mg | Freq: Every day | INTRAVENOUS | Status: DC

## 2020-05-30 MED ORDER — ACETAMINOPHEN 325 MG PO TABS
650.0000 mg | ORAL_TABLET | Freq: Once | ORAL | Status: AC | PRN
  Administered 2020-05-30: 650 mg via ORAL
  Filled 2020-05-30: qty 2

## 2020-05-30 MED ORDER — KETOROLAC TROMETHAMINE 30 MG/ML IJ SOLN
15.0000 mg | INTRAMUSCULAR | Status: AC
  Administered 2020-05-30: 15 mg via INTRAVENOUS
  Filled 2020-05-30: qty 1

## 2020-05-30 MED ORDER — SODIUM CHLORIDE 0.9 % IV SOLN: 200.0000 mg | Freq: Once | INTRAVENOUS | Status: DC

## 2020-05-30 MED ORDER — DEXAMETHASONE SODIUM PHOSPHATE 10 MG/ML IJ SOLN
10.0000 mg | Freq: Once | INTRAMUSCULAR | Status: AC
  Administered 2020-05-30: 10 mg via INTRAVENOUS
  Filled 2020-05-30: qty 1

## 2020-05-30 MED ORDER — IPRATROPIUM-ALBUTEROL 20-100 MCG/ACT IN AERS
1.0000 | INHALATION_SPRAY | Freq: Four times a day (QID) | RESPIRATORY_TRACT | Status: DC
  Administered 2020-05-30 – 2020-06-07 (×29): 1 via RESPIRATORY_TRACT
  Filled 2020-05-30: qty 4

## 2020-05-30 MED ORDER — SODIUM CHLORIDE 0.9 % IV BOLUS
1000.0000 mL | Freq: Once | INTRAVENOUS | Status: AC
  Administered 2020-05-30: 1000 mL via INTRAVENOUS

## 2020-05-30 MED ORDER — SODIUM CHLORIDE 0.9 % IV SOLN
500.0000 mg | Freq: Once | INTRAVENOUS | Status: AC
  Administered 2020-05-30: 500 mg via INTRAVENOUS
  Filled 2020-05-30: qty 500

## 2020-05-30 NOTE — Progress Notes (Signed)
Placed patient on HF 8 liters. IS and flutter initiated.

## 2020-05-30 NOTE — Consult Note (Signed)
Remdesivir - Pharmacy Brief Note   O:  ALT: not available  CXR: Multifocal bilateral peripheral predominant heterogeneous opacities, consistent with the sequela of COVID-19 infection.  SpO2: 88% on room air   A/P:  Remdesivir 200 mg IVPB once followed by 100 mg IVPB daily x 4 days.   Reatha Armour, PharmD Pharmacy Resident  05/30/2020 5:38 PM

## 2020-05-30 NOTE — ED Notes (Signed)
Attempted to call report to 1C- informed that MEWS score was too high. Floor asked to please read over handoff and an updated set of vitals will be placed in the EMR

## 2020-05-30 NOTE — ED Notes (Signed)
Patient ok to go to floor once covid result back.

## 2020-05-30 NOTE — ED Triage Notes (Signed)
Interpretor used during triage. PAtient has been ill since Tuesday 05/21/20. Reports positive for covid last week and kids who live in household. Pt c/o generalized pain all over, cough, no appetite, no smell, and sob.

## 2020-05-30 NOTE — ED Provider Notes (Signed)
[facility] Emergency Department Provider Note  ____________________________________________  Time seen: Approximately 5:26 PM  I have reviewed the triage vital signs and the nursing notes.   HISTORY  Chief Complaint Shortness of Breath, Generalized Body Aches, Fever, and Covid Positive    HPI Steven Luna is a 49 y.o. male with no significant past medical history, reports a positive Covid test 1 week ago at a mobile testing site who has been having progressive shortness of breath, mild chest pain, nonproductive cough, loss of appetite and poor oral intake, worsening over the past week.  Symptoms are waxing and waning without aggravating or alleviating factors.  Pain is 6/10 in intensity and nonradiating.      Past Medical History:  Diagnosis Date  . Lumbago of lumbar region with sciatica 02/23/2017  . Opiate use (22.5 MME/Day) 06/29/2016     Patient Active Problem List   Diagnosis Date Noted  . Healthcare maintenance 02/23/2017  . Lumbago of lumbar region with sciatica 02/23/2017  . Chronic pain 06/29/2016  . Long term current use of opiate analgesic 06/29/2016  . Long term prescription opiate use 06/29/2016  . Opiate use (22.5 MME/Day) 06/29/2016  . Chronic low back pain (Location of Primary Source of Pain) (Bilateral) (L>R) 06/29/2016  . Lumbar facet syndrome (Bilateral) (L>R) 06/29/2016  . Chronic lower extremity pain (Location of Secondary source of pain) (referred pain) (Left) 06/29/2016     History reviewed. No pertinent surgical history.   Prior to Admission medications   Medication Sig Start Date End Date Taking? Authorizing Provider  oxyCODONE-acetaminophen (PERCOCET/ROXICET) 5-325 MG tablet 1 po tid prn Earliest Fill Date: 06/20/16 06/20/16   [provider]     Allergies Patient has no known allergies.   History reviewed. No pertinent family history.  Social History Social History   Tobacco Use  . Smoking status:  Never Smoker  . Smokeless tobacco: Never Used  . Tobacco comment: no smoking denies  Substance Use Topics  . Alcohol use: Yes    Comment: occ  . Drug use: Never    Review of Systems  Constitutional: Positive fever and chills.  ENT:   Positive sore throat. No rhinorrhea. Cardiovascular:   Positive chest pain without syncope. Respiratory: Positive shortness of breath and nonproductive  cough. Gastrointestinal:   Negative for abdominal pain, vomiting and diarrhea.  Musculoskeletal:   Negative for focal pain or swelling All other systems reviewed and are negative except as documented above in ROS and HPI.  ____________________________________________   PHYSICAL EXAM:  VITAL SIGNS: ED Triage Vitals  Enc Vitals Group     BP 05/30/20 1205 139/83     Pulse Rate 05/30/20 1205 (!) 114     Resp 05/30/20 1205 (!) 22     Temp 05/30/20 1205 (!) 101.9 F (38.8 C)     Temp Source 05/30/20 1205 Oral     SpO2 05/30/20 1205 (!) 88 %     Weight 05/30/20 1214 185 lb (83.9 kg)     Height 05/30/20 1214 5\' 8"  (1.727 m)     Head Circumference --      Peak Flow --      Pain Score 05/30/20 1214 6     Pain Loc --      Pain Edu? --      Excl. in GC? --     Vital signs reviewed, nursing assessments reviewed.   Constitutional:   Alert and oriented. Non-toxic appearance. Eyes:   Conjunctivae are normal. EOMI. PERRL.  ENT      Head:   Normocephalic and atraumatic.      Nose:   Wearing a mask.      Mouth/Throat:   Wearing a mask.      Neck:   No meningismus. Full ROM. Hematological/Lymphatic/Immunilogical:   No cervical lymphadenopathy. Cardiovascular:   Tachycardia heart rate 110. Symmetric bilateral radial and DP pulses.  No murmurs. Cap refill less than 2 seconds. Respiratory:   Tachypnea, respiratory rate of 26-30.  Bilateral basilar crackles.  No wheezing Gastrointestinal:   Soft and nontender. Non distended. There is no CVA tenderness.  No rebound, rigidity, or guarding.  Tolerating oral  intake  Musculoskeletal:   Normal range of motion in all extremities. No joint effusions.  No lower extremity tenderness.  No edema. Neurologic:   Normal speech and language.  Motor grossly intact. No acute focal neurologic deficits are appreciated.  Skin:    Skin is warm, dry and intact. No rash noted.  No petechiae, purpura, or bullae.  ____________________________________________    LABS (pertinent positives/negatives) (all labs ordered are listed, but only abnormal results are displayed) Labs Reviewed  BASIC METABOLIC PANEL - Abnormal; Notable for the following components:      Result Value   Sodium 133 (*)    Chloride 96 (*)    Glucose, Bld 208 (*)    Calcium 8.0 (*)    All other components within normal limits  CULTURE, BLOOD (ROUTINE X 2)  CULTURE, BLOOD (ROUTINE X 2)  SARS CORONAVIRUS 2 BY RT PCR (HOSPITAL ORDER, PERFORMED IN [facility])  CBC  FIBRIN DERIVATIVES D-DIMER ([facility] ONLY)  PROCALCITONIN  LACTATE DEHYDROGENASE  FERRITIN  TRIGLYCERIDES  FIBRINOGEN  C-REACTIVE PROTEIN  LACTIC ACID, PLASMA  LACTIC ACID, PLASMA  TROPONIN I (HIGH SENSITIVITY)  TROPONIN I (HIGH SENSITIVITY)   ____________________________________________   EKG  Interpreted by me Sinus tachycardia rate 117.  Normal axis intervals QRS ST segments and T waves    ____________________________________________    RADIOLOGY  DG Chest 2 View  Result Date: 05/30/2020 CLINICAL DATA:  COVID EXAM: CHEST - 2 VIEW COMPARISON:  None. FINDINGS: The cardiomediastinal silhouette is mildly enlarged in contour. No pleural effusion. No pneumothorax. Multifocal bilateral peripheral predominant heterogeneous opacities, consistent with the sequela of COVID-19 infection. Visualized abdomen is unremarkable. No acute osseous abnormality noted. IMPRESSION: Multifocal bilateral peripheral predominant heterogeneous opacities, consistent with the sequela of COVID-19 infection. Recommend follow-up  radiograph in 3 months to assess for resolution. Electronically Signed   By: Meda Klinefelter MD   On: 05/30/2020 13:22    ____________________________________________   PROCEDURES .Critical Care Performed by: Sharman Cheek, MD Authorized by: Sharman Cheek, MD   Critical care provider statement:    Critical care time (minutes):  35   Critical care time was exclusive of:  Separately billable procedures and treating other patients   Critical care was necessary to treat or prevent imminent or life-threatening deterioration of the following conditions:  Respiratory failure and sepsis   Critical care was time spent personally by me on the following activities:  Development of treatment plan with patient or surrogate, discussions with consultants, evaluation of patient's response to treatment, examination of patient, obtaining history from patient or surrogate, ordering and performing treatments and interventions, ordering and review of laboratory studies, ordering and review of radiographic studies, pulse oximetry, re-evaluation of patient's condition and review of old charts    ____________________________________________  DIFFERENTIAL DIAGNOSIS   COVID-19 pneumonia, dehydration, bacterial pneumonia.  Doubt ACS  PE dissection AAA pneumothorax pericarditis.  CLINICAL IMPRESSION / ASSESSMENT AND PLAN / ED COURSE  Medications ordered in the ED: Medications  dexamethasone (DECADRON) injection 10 mg (has no administration in time range)  sodium chloride 0.9 % bolus 1,000 mL (has no administration in time range)  cefTRIAXone (ROCEPHIN) 2 g in sodium chloride 0.9 % 100 mL IVPB (has no administration in time range)  azithromycin (ZITHROMAX) 500 mg in sodium chloride 0.9 % 250 mL IVPB (has no administration in time range)  acetaminophen (TYLENOL) tablet 650 mg (650 mg Oral Given 05/30/20 1210)    Pertinent labs & imaging results that were available during my care of the patient were  reviewed by me and considered in my medical decision making (see chart for details).  Steven Luna was evaluated in Emergency Department on 05/30/2020 for the symptoms described in the history of present illness. He was evaluated in the context of the global COVID-19 pandemic, which necessitated consideration that the patient might be at risk for infection with the SARS-CoV-2 virus that causes COVID-19. Institutional protocols and algorithms that pertain to the evaluation of patients at risk for COVID-19 are in a state of rapid change based on information released by regulatory bodies including the CDC and federal and state organizations. These policies and algorithms were followed during the patient's care in the ED.   Patient presents with fever tachycardia, worsening shortness of breath and hypoxia requiring 2 L nasal cannula.  He reports a positive Covid test, presentation is consistent with Covid pneumonia.  However, unable to verify his Covid result in EMR or care everywhere, so I will reorder, and in the meantime initiate a sepsis work-up and give a dose of ceftriaxone and azithromycin.  IV fluids for hydration.  Will need to admit for hypoxia.  Will order Decadron and remdesivir for Covid treatment.      ____________________________________________   FINAL CLINICAL IMPRESSION(S) / ED DIAGNOSES    Final diagnoses:  Pneumonia due to COVID-19 virus  Acute respiratory failure with hypoxia [facility])     ED Discharge Orders    None      Portions of this note were generated with dragon dictation software. Dictation errors may occur despite best attempts at proofreading.   Sharman Cheek, MD 05/30/20 [phone]

## 2020-05-30 NOTE — ED Notes (Signed)
Lab called with critical. Lactate 2.4

## 2020-05-30 NOTE — H&P (Signed)
History and Physical    Steven Edison NOB:[id number] DOB: 05/02/1971 DOA: 05/30/2020  PCP: Merri Ray, MD  Patient coming from: Home  I have personally briefly reviewed patient's old medical records in [facility]  Chief Complaint: Shortness of breath  HPI: Steven Luna is a 49 y.o. male with medical history significant of chronic pain on opioids presented to ED with a complaint of shortness of breath, body aches, fever since little over 7 days.  According to patient, he started having symptoms and then he was tested positive for Covid about a week ago at mobile unit.  Patient continues to have progressive worsening of the symptoms with some nonproductive cough, loss of appetite and some mild intermittent chest pain.  He describes pain as sharp, nonradiating, aggravated with cough and relieved with rest.  Symptoms are intermittent.  Pain is 6-7 out of 10.  He cannot pinpoint on any sick contact.    ED Course: Upon arrival to ED, he was febrile with temperature of 101.9, tachycardia and tachypneic and hypoxic 88% on room air requiring 2 L of oxygen.  CBC and BMP unremarkable.  2 sets of troponin negative.  Only inflammatory markers including another repeat Covid test are all pending.  Hospital service were consulted to admit the patient for further management.  Review of Systems: As per HPI otherwise negative.    Past Medical History:  Diagnosis Date  . Lumbago of lumbar region with sciatica 02/23/2017  . Opiate use (22.5 MME/Day) 06/29/2016    History reviewed. No pertinent surgical history.   reports that he has never smoked. He has never used smokeless tobacco. He reports current alcohol use. He reports that he does not use drugs.  No Known Allergies  History reviewed. No pertinent family history.  Prior to Admission medications   Medication Sig Start Date End Date Taking? Authorizing Provider  oxyCODONE-acetaminophen (PERCOCET/ROXICET) 5-325 MG tablet 1 po tid prn  Earliest Fill Date: 06/20/16 06/20/16   [provider]    Physical Exam: Vitals:   05/30/20 1207 05/30/20 1211 05/30/20 1214 05/30/20 1449  BP:    121/81  Pulse:    (!) 108  Resp:    (!) 22  Temp:    (!) 101.4 F (38.6 C)  TempSrc:    Oral  SpO2: (!) 88% 93%  92%  Weight:   83.9 kg   Height:   5\' 8"  (1.727 m)     Constitutional: NAD, calm, comfortable Vitals:   05/30/20 1207 05/30/20 1211 05/30/20 1214 05/30/20 1449  BP:    121/81  Pulse:    (!) 108  Resp:    (!) 22  Temp:    (!) 101.4 F (38.6 C)  TempSrc:    Oral  SpO2: (!) 88% 93%  92%  Weight:   83.9 kg   Height:   5\' 8"  (1.727 m)    Eyes: PERRL, lids and conjunctivae normal ENMT: Mucous membranes are moist. Posterior pharynx clear of any exudate or lesions.Normal dentition.  Neck: normal, supple, no masses, no thyromegaly Respiratory: clear to auscultation bilaterally, no wheezing, no crackles. Normal respiratory effort. No accessory muscle use.  Cardiovascular: Regular rate and rhythm, no murmurs / rubs / gallops. No extremity edema. 2+ pedal pulses. No carotid bruits.  Abdomen: no tenderness, no masses palpated. No hepatosplenomegaly. Bowel sounds positive.  Musculoskeletal: no clubbing / cyanosis. No joint deformity upper and lower extremities. Good ROM, no contractures. Normal muscle tone.  Skin: no rashes, lesions, ulcers. No induration  Neurologic: CN 2-12 grossly intact. Sensation intact, DTR normal. Strength 5/5 in all 4.  Psychiatric: Normal judgment and insight. Alert and oriented x 3. Normal mood.    Labs on Admission: I have personally reviewed following labs and imaging studies  CBC: Recent Labs  Lab 05/30/20 1305  WBC 7.0  HGB 16.3  HCT 46.4  MCV 94.9  PLT 201   Basic Metabolic Panel: Recent Labs  Lab 05/30/20 1305  NA 133*  K 4.2  CL 96*  CO2 23  GLUCOSE 208*  BUN 11  CREATININE 0.71  CALCIUM 8.0*   GFR: Estimated Creatinine Clearance: 119.2 mL/min (by C-G formula based on  SCr of 0.71 mg/dL). Liver Function Tests: No results for input(s): AST, ALT, ALKPHOS, BILITOT, PROT, ALBUMIN in the last 168 hours. No results for input(s): LIPASE, AMYLASE in the last 168 hours. No results for input(s): AMMONIA in the last 168 hours. Coagulation Profile: No results for input(s): INR, PROTIME in the last 168 hours. Cardiac Enzymes: No results for input(s): CKTOTAL, CKMB, CKMBINDEX, TROPONINI in the last 168 hours. BNP (last 3 results) No results for input(s): PROBNP in the last 8760 hours. HbA1C: No results for input(s): HGBA1C in the last 72 hours. CBG: No results for input(s): GLUCAP in the last 168 hours. Lipid Profile: No results for input(s): CHOL, HDL, LDLCALC, TRIG, CHOLHDL, LDLDIRECT in the last 72 hours. Thyroid Function Tests: No results for input(s): TSH, T4TOTAL, FREET4, T3FREE, THYROIDAB in the last 72 hours. Anemia Panel: No results for input(s): VITAMINB12, FOLATE, FERRITIN, TIBC, IRON, RETICCTPCT in the last 72 hours. Urine analysis:    Component Value Date/Time   COLORURINE Yellow 10/15/2014 0817   APPEARANCEUR Clear 10/15/2014 0817   LABSPEC 1.038 10/15/2014 0817   PHURINE 5.0 10/15/2014 0817   GLUCOSEU >=500 10/15/2014 0817   HGBUR Negative 10/15/2014 0817   BILIRUBINUR Negative 10/15/2014 0817   KETONESUR 1+ 10/15/2014 0817   PROTEINUR Negative 10/15/2014 0817   NITRITE Negative 10/15/2014 0817   LEUKOCYTESUR Negative 10/15/2014 0817    Radiological Exams on Admission: DG Chest 2 View  Result Date: 05/30/2020 CLINICAL DATA:  COVID EXAM: CHEST - 2 VIEW COMPARISON:  None. FINDINGS: The cardiomediastinal silhouette is mildly enlarged in contour. No pleural effusion. No pneumothorax. Multifocal bilateral peripheral predominant heterogeneous opacities, consistent with the sequela of COVID-19 infection. Visualized abdomen is unremarkable. No acute osseous abnormality noted. IMPRESSION: Multifocal bilateral peripheral predominant heterogeneous  opacities, consistent with the sequela of COVID-19 infection. Recommend follow-up radiograph in 3 months to assess for resolution. Electronically Signed   By: Meda Klinefelter MD   On: 05/30/2020 13:22    EKG: Independently reviewed.  Sinus tachycardia  Assessment/Plan Active Problems:   Chronic pain   Chronic low back pain (Location of Primary Source of Pain) (Bilateral) (L>R)   Pneumonia due to COVID-19 virus   Severe sepsis (HCC)   Acute respiratory failure with hypoxia (HCC)   Severe sepsis and acute hypoxic respiratory failure secondary to COVID-19 pneumonia: Patient meets severe sepsis criteria based on fever, tachycardia tachypnea and hypoxia.  This is due to COVID-19 pneumonia.  Currently requiring 2 L of oxygen.  He has received dexamethasone and remdesivir in the ED.  I will start him on Solu-Medrol as well as remdesivir per pharmacy protocol.  We will hold off to any baricitinib at this point in time as we do not have any inflammatory markers and is currently only on 2 L nasal oxygen.  We will start him on zinc, antitussives,  bronchodilator, vitamin C.   The treatment plan and use of medications and known side effects were discussed with patient/family, they were clearly explained that there is no proven definitive treatment for COVID-19 infection, any medications used here are based on published clinical articles/anecdotal data which are not peer-reviewed or randomized control trials.  Complete risks and long-term side effects are unknown, however in the best clinical judgment they seem to be of some clinical benefit rather than medical risks.  Patient/family agree with the treatment plan and want to receive the given medications.  Chronic pain syndrome: Takes Percocet at home.  Will resume that tomorrow.  DVT prophylaxis: enoxaparin (LOVENOX) injection 40 mg Start: 05/30/20 1800 Code Status: Full code Family Communication: None present at bedside.  Plan of care discussed with  patient in length and he verbalized understanding and agreed with it. Disposition Plan: Discharge home once medically stable in next 2 to 3 days Consults called: None Admission status: Inpatient   Status is: Inpatient  Remains inpatient appropriate because:Inpatient level of care appropriate due to severity of illness   Dispo: The patient is from: Home              Anticipated d/c is to: Home              Anticipated d/c date is: 3 days              Patient currently is not medically stable to d/c.   Hughie Closs MD [facility]  05/30/2020, 5:47 PM  To contact the attending provider between 7A-7P or the covering provider during after hours 7P-7A, please log into the web site [email or web address]

## 2020-05-31 DIAGNOSIS — J9600 Acute respiratory failure, unspecified whether with hypoxia or hypercapnia: Secondary | ICD-10-CM | POA: Diagnosis not present

## 2020-05-31 DIAGNOSIS — J9601 Acute respiratory failure with hypoxia: Secondary | ICD-10-CM | POA: Diagnosis not present

## 2020-05-31 DIAGNOSIS — A419 Sepsis, unspecified organism: Secondary | ICD-10-CM | POA: Diagnosis not present

## 2020-05-31 DIAGNOSIS — U071 COVID-19: Secondary | ICD-10-CM | POA: Diagnosis not present

## 2020-05-31 MED ORDER — BARICITINIB 2 MG PO TABS
4.0000 mg | ORAL_TABLET | Freq: Every day | ORAL | Status: DC
  Administered 2020-05-31 – 2020-06-07 (×8): 4 mg via ORAL
  Filled 2020-05-31 (×8): qty 2

## 2020-05-31 NOTE — Progress Notes (Signed)
PROGRESS NOTE    Steven Luna  RFF:[id number] DOB: March 26, 1971 DOA: 05/30/2020 PCP: Sharlet Salina, MD   Brief Narrative:  HPI: Steven Luna is a 49 y.o. male with medical history significant of chronic pain on opioids presented to ED with a complaint of shortness of breath, body aches, fever since little over 7 days.  According to patient, he started having symptoms and then he was tested positive for Covid about a week ago at mobile unit.  Patient continues to have progressive worsening of the symptoms with some nonproductive cough, loss of appetite and some mild intermittent chest pain.  He describes pain as sharp, nonradiating, aggravated with cough and relieved with rest.  Symptoms are intermittent.  Pain is 6-7 out of 10.  He cannot pinpoint on any sick contact.    ED Course: Upon arrival to ED, he was febrile with temperature of 101.9, tachycardia and tachypneic and hypoxic 88% on room air requiring 2 L of oxygen.  CBC and BMP unremarkable.  2 sets of troponin negative.  Only inflammatory markers including another repeat Covid test are all pending.  Hospital service were consulted to admit the patient for further management.  Assessment & Plan:   Active Problems:   Chronic pain   Chronic low back pain (Location of Primary Source of Pain) (Bilateral) (L>R)   Pneumonia due to COVID-19 virus   Severe sepsis (HCC)   Acute respiratory failure with hypoxia (HCC)   Acute respiratory failure due to COVID-19 (HCC)   Severe sepsis and acute hypoxic respiratory failure secondary to COVID-19 pneumonia: Patient met severe sepsis criteria based on fever, tachycardia tachypnea and hypoxia and lactic acid of 2.4.  This is due to COVID-19 pneumonia.  Although patient states that he feels better than yesterday but his oxygen demand has been going higher and currently requiring 15 L high flow oxygen.  We will continue following: Remdesivir, per pharmacy protocol IV Solu-Medrol, antitussive,  bronchodilator, zinc, vitamin C.  Actemra/Baricitinib  off label use - patient was told that if COVID-19 pneumonitis gets worse we might potentially use Actemra/ barititinib off label, patient denies any known history of active diverticulitis, tuberculosis or hepatitis, understands the risks and benefits and wants to proceed with Actemra/barititinib treatment if required.   Currently due to rapidly worsening oxygenation, will start on baricitinib.    Chronic pain syndrome: Takes Percocet at home.  Continue that.  DVT prophylaxis:    Code Status: Full Code  Family Communication:  None present at bedside.  Plan of care discussed with patient in length and he verbalized understanding and agreed with it.  No family member listed either.  Status is: Inpatient  Remains inpatient appropriate because:Hemodynamically unstable   Dispo: The patient is from: Home              Anticipated d/c is to: Home              Anticipated d/c date is: > 3 days              Patient currently is not medically stable to d/c.        Estimated body mass index is 28.13 kg/m as calculated from the following:   Height as of this encounter: 5' 8"  (1.727 m).   Weight as of this encounter: 83.9 kg.      Nutritional status:               Consultants:   None  Procedures:   None  Antimicrobials:  Anti-infectives (From admission, onward)   Start     Dose/Rate Route Frequency Ordered Stop   05/31/20 1000  remdesivir 100 mg in sodium chloride 0.9 % 100 mL IVPB       "Followed by" Linked Group Details   100 mg 200 mL/hr over 30 Minutes Intravenous Daily 05/30/20 1735 06/04/20 0959   05/31/20 1000  remdesivir 100 mg in sodium chloride 0.9 % 100 mL IVPB  Status:  Discontinued       "Followed by" Linked Group Details   100 mg 200 mL/hr over 30 Minutes Intravenous Daily 05/30/20 1746 05/30/20 1755   05/30/20 1830  remdesivir 200 mg in sodium chloride 0.9% 250 mL IVPB       "Followed by" Linked  Group Details   200 mg 580 mL/hr over 30 Minutes Intravenous Once 05/30/20 1735 05/30/20 2038   05/30/20 1800  remdesivir 200 mg in sodium chloride 0.9% 250 mL IVPB  Status:  Discontinued       "Followed by" Linked Group Details   200 mg 580 mL/hr over 30 Minutes Intravenous Once 05/30/20 1746 05/30/20 1755   05/30/20 1730  cefTRIAXone (ROCEPHIN) 2 g in sodium chloride 0.9 % 100 mL IVPB        2 g 200 mL/hr over 30 Minutes Intravenous  Once 05/30/20 1719 05/30/20 1841   05/30/20 1730  azithromycin (ZITHROMAX) 500 mg in sodium chloride 0.9 % 250 mL IVPB        500 mg 250 mL/hr over 60 Minutes Intravenous  Once 05/30/20 1719 05/30/20 1951         Subjective: Seen and examined.  Despite of requiring high flow oxygen, he stated that he was feeling better.  Did not have any new complaint.  Objective: Vitals:   05/31/20 1130 05/31/20 1200 05/31/20 1300 05/31/20 1400  BP:  116/70 129/85 (!) 142/94  Pulse: (!) 104 (!) 104  (!) 108  Resp: (!) 33 (!) 33  (!) 31  Temp:      TempSrc:      SpO2: 97% 97%  93%  Weight:      Height:        Intake/Output Summary (Last 24 hours) at 05/31/2020 1428 Last data filed at 05/31/2020 1224 Gross per 24 hour  Intake 200 ml  Output 625 ml  Net -425 ml   Filed Weights   05/30/20 1214  Weight: 83.9 kg    Examination:  General exam: Appears calm and comfortable  Respiratory system: Diminished breath sounds bilaterally. Respiratory effort normal. Cardiovascular system: S1 & S2 heard, RRR. No JVD, murmurs, rubs, gallops or clicks. No pedal edema. Gastrointestinal system: Abdomen is nondistended, soft and nontender. No organomegaly or masses felt. Normal bowel sounds heard. Central nervous system: Alert and oriented. No focal neurological deficits. Extremities: Symmetric 5 x 5 power. Skin: No rashes, lesions or ulcers Psychiatry: Judgement and insight appear normal. Mood & affect appropriate.    Data Reviewed: I have personally reviewed  following labs and imaging studies  CBC: Recent Labs  Lab 05/30/20 1305 05/30/20 1755  WBC 7.0 7.7  HGB 16.3 16.8  HCT 46.4 47.9  MCV 94.9 95.8  PLT 201 616   Basic Metabolic Panel: Recent Labs  Lab 05/30/20 1305 05/30/20 1755  NA 133*  --   K 4.2  --   CL 96*  --   CO2 23  --   GLUCOSE 208*  --   BUN 11  --   CREATININE 0.71 0.75  CALCIUM  8.0*  --    GFR: Estimated Creatinine Clearance: 119.2 mL/min (by C-G formula based on SCr of 0.75 mg/dL). Liver Function Tests: No results for input(s): AST, ALT, ALKPHOS, BILITOT, PROT, ALBUMIN in the last 168 hours. No results for input(s): LIPASE, AMYLASE in the last 168 hours. No results for input(s): AMMONIA in the last 168 hours. Coagulation Profile: No results for input(s): INR, PROTIME in the last 168 hours. Cardiac Enzymes: No results for input(s): CKTOTAL, CKMB, CKMBINDEX, TROPONINI in the last 168 hours. BNP (last 3 results) No results for input(s): PROBNP in the last 8760 hours. HbA1C: No results for input(s): HGBA1C in the last 72 hours. CBG: No results for input(s): GLUCAP in the last 168 hours. Lipid Profile: Recent Labs    05/30/20 1755  TRIG 175*   Thyroid Function Tests: No results for input(s): TSH, T4TOTAL, FREET4, T3FREE, THYROIDAB in the last 72 hours. Anemia Panel: Recent Labs    05/30/20 1755  FERRITIN 2,036*   Sepsis Labs: Recent Labs  Lab 05/30/20 1755  PROCALCITON 0.30  LATICACIDVEN 2.4*    Recent Results (from the past 240 hour(s))  Culture, blood (Routine X 2) w Reflex to ID Panel     Status: None (Preliminary result)   Collection Time: 05/30/20  5:55 PM   Specimen: BLOOD  Result Value Ref Range Status   Specimen Description BLOOD LEFT ANTECUBITAL  Final   Special Requests   Final    BOTTLES DRAWN AEROBIC AND ANAEROBIC Blood Culture adequate volume   Culture   Final    NO GROWTH < 12 HOURS Performed at [facility], [address] [place], [place] [address]     Report Status PENDING  Incomplete  Respiratory Panel by RT PCR (Flu A&B, Covid) - Nasopharyngeal Swab     Status: Abnormal   Collection Time: 05/30/20  5:55 PM   Specimen: Nasopharyngeal Swab  Result Value Ref Range Status   SARS Coronavirus 2 by RT PCR POSITIVE (A) NEGATIVE Final    Comment: RESULT CALLED TO, READ BACK BY AND VERIFIED WITH: [facility] 05/30/20 AT 1922 BY ACR (NOTE) SARS-CoV-2 target nucleic acids are DETECTED.  SARS-CoV-2 RNA is generally detectable in upper respiratory specimens  during the acute phase of infection. Positive results are indicative of the presence of the identified virus, but do not rule out bacterial infection or co-infection with other pathogens not detected by the test. Clinical correlation with patient history and other diagnostic information is necessary to determine patient infection status. The expected result is Negative.  Fact Sheet for Patients:  PinkCheek.be  Fact Sheet for Healthcare Providers: GravelBags.it  This test is not yet approved or cleared by the [place] FDA and  has been authorized for detection and/or diagnosis of SARS-CoV-2 by FDA under an Emergency Use Authorization (EUA).  This EUA will remain in effect (meaning this test can  be used) for the duration of  the COVID-19 declaration under Section 564(b)(1) of the Act, 21 U.S.C. section 360bbb-3(b)(1), unless the authorization is terminated or revoked sooner.      Influenza A by PCR NEGATIVE NEGATIVE Final   Influenza B by PCR NEGATIVE NEGATIVE Final    Comment: (NOTE) The Xpert Xpress SARS-CoV-2/FLU/RSV assay is intended as an aid in  the diagnosis of influenza from Nasopharyngeal swab specimens and  should not be used as a sole basis for treatment. Nasal washings and  aspirates are unacceptable for Xpert Xpress SARS-CoV-2/FLU/RSV  testing.  Fact Sheet for  Patients: PinkCheek.be  Fact Sheet for Healthcare Providers: GravelBags.it  This test is not yet approved or cleared by the [place] FDA and  has been authorized for detection and/or diagnosis of SARS-CoV-2 by  FDA under an Emergency Use Authorization (EUA). This EUA will remain  in effect (meaning this test can be used) for the duration of the  Covid-19 declaration under Section 564(b)(1) of the Act, 21  U.S.C. section 360bbb-3(b)(1), unless the authorization is  terminated or revoked. Performed at [facility], [address] [place], [place] [address]   Culture, blood (routine x 2)     Status: None (Preliminary result)   Collection Time: 05/30/20  6:20 PM   Specimen: BLOOD  Result Value Ref Range Status   Specimen Description BLOOD RIGTH [facility]  Final   Special Requests   Final    BOTTLES DRAWN AEROBIC AND ANAEROBIC Blood Culture adequate volume   Culture   Final    NO GROWTH < 12 HOURS Performed at [facility] [address] [place], [place] [address]    Report Status PENDING  Incomplete      Radiology Studies: DG Chest 2 View  Result Date: 05/30/2020 CLINICAL DATA:  COVID EXAM: CHEST - 2 VIEW COMPARISON:  None. FINDINGS: The cardiomediastinal silhouette is mildly enlarged in contour. No pleural effusion. No pneumothorax. Multifocal bilateral peripheral predominant heterogeneous opacities, consistent with the sequela of COVID-19 infection. Visualized abdomen is unremarkable. No acute osseous abnormality noted. IMPRESSION: Multifocal bilateral peripheral predominant heterogeneous opacities, consistent with the sequela of COVID-19 infection. Recommend follow-up radiograph in 3 months to assess for resolution. Electronically Signed   By: Valentino Saxon MD   On: 05/30/2020 13:22    Scheduled Meds: . vitamin C  500 mg Oral Daily  . baricitinib  4 mg Oral Daily  . Ipratropium-Albuterol  1 puff  Inhalation Q6H  . methylPREDNISolone (SOLU-MEDROL) injection  0.5 mg/kg Intravenous Q12H   Followed by  . [START ON 06/03/2020] predniSONE  50 mg Oral Daily  . sodium chloride flush  3 mL Intravenous Q12H  . zinc sulfate  220 mg Oral Daily   Continuous Infusions: . remdesivir 100 mg in NS 100 mL Stopped (05/31/20 1224)     LOS: 1 day   Time spent: 32 minutes   Darliss Cheney, MD [facility]  05/31/2020, 2:28 PM   To contact the attending provider between 7A-7P or the covering provider during after hours 7P-7A, please log into the web site [email or web address].

## 2020-05-31 NOTE — ED Notes (Signed)
Transport to floor room 231.AS

## 2020-05-31 NOTE — ED Notes (Signed)
Given breakfast tray, pt declined at this time.

## 2020-05-31 NOTE — ED Notes (Signed)
First call to floor, per Trey Paula, sec, RN will call me back

## 2020-05-31 NOTE — ED Notes (Addendum)
Begin weaning from O2 per Dr. Jacqulyn Bath.  Non-rebreather removed, [place] left in place, O2 sat 94% at this time, RR 27.  Goal is to keep sats just above 90%.

## 2020-05-31 NOTE — ED Notes (Addendum)
Pt up to toilet independently at this time having semi liquid stools with gas noted. Pt has had 4 BM since 1900. Pt in NAD & st "I feel a lot better"

## 2020-06-01 DIAGNOSIS — J1282 Pneumonia due to coronavirus disease 2019: Secondary | ICD-10-CM

## 2020-06-01 DIAGNOSIS — U071 COVID-19: Secondary | ICD-10-CM | POA: Diagnosis not present

## 2020-06-01 DIAGNOSIS — A419 Sepsis, unspecified organism: Secondary | ICD-10-CM | POA: Diagnosis not present

## 2020-06-01 DIAGNOSIS — J9600 Acute respiratory failure, unspecified whether with hypoxia or hypercapnia: Secondary | ICD-10-CM | POA: Diagnosis not present

## 2020-06-01 LAB — COMPREHENSIVE METABOLIC PANEL
ALT: 44 U/L (ref 0–44)
AST: 49 U/L — ABNORMAL HIGH (ref 15–41)
Albumin: 3.2 g/dL — ABNORMAL LOW (ref 3.5–5.0)
Alkaline Phosphatase: 117 U/L (ref 38–126)
Anion gap: 19 — ABNORMAL HIGH (ref 5–15)
BUN: 24 mg/dL — ABNORMAL HIGH (ref 6–20)
CO2: 14 mmol/L — ABNORMAL LOW (ref 22–32)
Calcium: 8.3 mg/dL — ABNORMAL LOW (ref 8.9–10.3)
Chloride: 102 mmol/L (ref 98–111)
Creatinine, Ser: 0.78 mg/dL (ref 0.61–1.24)
GFR calc Af Amer: >60 mL/min (ref >60)
GFR calc non Af Amer: >60 mL/min (ref >60)
Glucose, Bld: 398 mg/dL — ABNORMAL HIGH (ref 70–99)
Potassium: 4.8 mmol/L (ref 3.5–5.1)
Sodium: 135 mmol/L (ref 135–145)
Total Bilirubin: 1.3 mg/dL — ABNORMAL HIGH (ref 0.3–1.2)
Total Protein: 7.4 g/dL (ref 6.5–8.1)

## 2020-06-01 LAB — CBC WITH DIFFERENTIAL/PLATELET
Abs Immature Granulocytes: 0.06 10*3/uL (ref 0.00–0.07)
Basophils Absolute: 0 10*3/uL (ref 0.0–0.1)
Basophils Relative: 0 %
Eosinophils Absolute: 0 10*3/uL (ref 0.0–0.5)
Eosinophils Relative: 0 %
HCT: 47.1 % (ref 39.0–52.0)
Hemoglobin: 16.7 g/dL (ref 13.0–17.0)
Immature Granulocytes: 1 %
Lymphocytes Relative: 22 %
Lymphs Abs: 1.5 10*3/uL (ref 0.7–4.0)
MCH: 33.8 pg (ref 26.0–34.0)
MCHC: 35.5 g/dL (ref 30.0–36.0)
MCV: 95.3 fL (ref 80.0–100.0)
Monocytes Absolute: 0.6 10*3/uL (ref 0.1–1.0)
Monocytes Relative: 9 %
Neutro Abs: 4.6 10*3/uL (ref 1.7–7.7)
Neutrophils Relative %: 68 %
Platelets: 337 10*3/uL (ref 150–400)
RBC: 4.94 MIL/uL (ref 4.22–5.81)
RDW: 12.1 % (ref 11.5–15.5)
WBC: 6.8 10*3/uL (ref 4.0–10.5)
nRBC: 0 % (ref 0.0–0.2)

## 2020-06-01 LAB — FERRITIN: Ferritin: 1966 ng/mL — ABNORMAL HIGH (ref 24–336)

## 2020-06-01 LAB — C-REACTIVE PROTEIN: CRP: 16.5 mg/dL — ABNORMAL HIGH (ref <1.0)

## 2020-06-01 LAB — FIBRIN DERIVATIVES D-DIMER (ARMC ONLY): Fibrin derivatives D-dimer (ARMC): 1554.52 ng/mL (FEU) — ABNORMAL HIGH (ref 0.00–499.00)

## 2020-06-01 LAB — PHOSPHORUS: Phosphorus: 3.6 mg/dL (ref 2.5–4.6)

## 2020-06-01 LAB — MAGNESIUM: Magnesium: 3 mg/dL — ABNORMAL HIGH (ref 1.7–2.4)

## 2020-06-01 NOTE — Progress Notes (Signed)
   06/01/20 0032  Assess: MEWS Score  Temp (!) 97.2 F (36.2 C)  BP 124/86  Pulse Rate (!) 101  Resp (!) 27  SpO2 90 %  O2 Device HFNC  O2 Flow Rate (L/min) 12 L/min  Assess: MEWS Score  MEWS Temp 0  MEWS Systolic 0  MEWS Pulse 1  MEWS RR 2  MEWS LOC 0  MEWS Score 3  MEWS Score Color Yellow  Assess: if the MEWS score is Yellow or Red  Were vital signs taken at a resting state? Yes  Focused Assessment No change from prior assessment  Early Detection of Sepsis Score *See Row Information* Medium  MEWS guidelines implemented *See Row Information* Yes  Treat  MEWS Interventions Consulted Respiratory Therapy  Pain Scale 0-10  Pain Score 0

## 2020-06-01 NOTE — Progress Notes (Signed)
Pt received to 2A room 231 from ED.  Pt oriented to room and call bell.  Pt denies pain but is weak.  ST 101.  Resp shallow but fast at 27 resp/minute.  RT in and pt connected to O2 HF ( not heated) at 12 liters w/ O2 sat of 90%.  See admit database, assessment and vs's for further details.

## 2020-06-01 NOTE — Progress Notes (Signed)
PROGRESS NOTE    Steven Luna  JKD:[id number] DOB: Sep 10, 1971 DOA: 05/30/2020 PCP: Sharlet Salina, MD   Brief Narrative:  Steven Koning is a 49 y.o. male with medical history significant of chronic pain on opioids presented to ED with a complaint of shortness of breath, body aches, fever since little over 7 days.  According to patient,  he was tested positive for Covid about a week ago at mobile unit.  Patient continued to have progressive worsening of the symptoms with some nonproductive cough, loss of appetite and some mild intermittent chest pain so he came to ED.  Upon arrival to ED, he was febrile with temperature of 101.9, tachycardia and tachypneic and hypoxic 88% on room air requiring 2 L of oxygen.  CBC and BMP unremarkable.  2 sets of troponin negative.    He was tested positive for COVID-19 again with elevated noncommittal markers. Hospital service were consulted to admit the patient for further management.  He was started on IV Solu-Medrol and remdesivir.  Initially he was requiring 4 L of oxygen but overnight he required almost 10 to 12 L of high flow oxygen.  He was then started on baricitinib next morning.  Assessment & Plan:   Active Problems:   Chronic pain   Chronic low back pain (Location of Primary Source of Pain) (Bilateral) (L>R)   Pneumonia due to COVID-19 virus   Severe sepsis (HCC)   Acute respiratory failure with hypoxia (HCC)   Acute respiratory failure due to COVID-19 (HCC)   Severe sepsis and acute hypoxic respiratory failure secondary to COVID-19 pneumonia: Patient met severe sepsis criteria based on fever, tachycardia tachypnea and hypoxia and lactic acid of 2.4.  This is due to COVID-19 pneumonia.  Patient feels better than yesterday and is requiring less amount of oxygen and currently on 10 L of high flow oxygen.  We will continue following: Remdesivir, per pharmacy protocol Baricitinib IV Solu-Medrol, antitussive, bronchodilator, zinc, vitamin C.    Chronic pain syndrome: Takes Percocet at home.  Continue that.  DVT prophylaxis:    Code Status: Full Code  Family Communication:  None present at bedside.  Plan of care discussed with patient in length and he verbalized understanding and agreed with it.  No family member listed either.  Status is: Inpatient  Remains inpatient appropriate because:Hemodynamically unstable   Dispo: The patient is from: Home              Anticipated d/c is to: Home              Anticipated d/c date is: > 3 days              Patient currently is not medically stable to d/c.        Estimated body mass index is 28.13 kg/m as calculated from the following:   Height as of this encounter: $RemoveBeforeD'5\' 8"'[id number]$  (1.727 m).   Weight as of this encounter: 83.9 kg.      Nutritional status:               Consultants:   None  Procedures:   None  Antimicrobials:  Anti-infectives (From admission, onward)   Start     Dose/Rate Route Frequency Ordered Stop   05/31/20 1000  remdesivir 100 mg in sodium chloride 0.9 % 100 mL IVPB       "Followed by" Linked Group Details   100 mg 200 mL/hr over 30 Minutes Intravenous Daily 05/30/20 1735 06/04/20 0959   05/31/20 1000  remdesivir 100 mg in sodium chloride 0.9 % 100 mL IVPB  Status:  Discontinued       "Followed by" Linked Group Details   100 mg 200 mL/hr over 30 Minutes Intravenous Daily 05/30/20 1746 05/30/20 1755   05/30/20 1830  remdesivir 200 mg in sodium chloride 0.9% 250 mL IVPB       "Followed by" Linked Group Details   200 mg 580 mL/hr over 30 Minutes Intravenous Once 05/30/20 1735 05/30/20 2038   05/30/20 1800  remdesivir 200 mg in sodium chloride 0.9% 250 mL IVPB  Status:  Discontinued       "Followed by" Linked Group Details   200 mg 580 mL/hr over 30 Minutes Intravenous Once 05/30/20 1746 05/30/20 1755   05/30/20 1730  cefTRIAXone (ROCEPHIN) 2 g in sodium chloride 0.9 % 100 mL IVPB        2 g 200 mL/hr over 30 Minutes Intravenous  Once  05/30/20 1719 05/30/20 1841   05/30/20 1730  azithromycin (ZITHROMAX) 500 mg in sodium chloride 0.9 % 250 mL IVPB        500 mg 250 mL/hr over 60 Minutes Intravenous  Once 05/30/20 1719 05/30/20 1951         Subjective: Seen and examined.  He states that his breathing is better than yesterday.  No new complaint.  Objective: Vitals:   06/01/20 0032 06/01/20 0511 06/01/20 0738 06/01/20 0823  BP: 124/86 121/90  135/87  Pulse: (!) 101 98 99 (!) 105  Resp: (!) 27 (!) 29 (!) 23 (!) 27  Temp: (!) 97.2 F (36.2 C) 98 F (36.7 C)  98.8 F (37.1 C)  TempSrc: Oral Oral  Oral  SpO2: 90% 92% 90% 91%  Weight:      Height:        Intake/Output Summary (Last 24 hours) at 06/01/2020 1052 Last data filed at 06/01/2020 7616 Gross per 24 hour  Intake 100 ml  Output 3575 ml  Net -3475 ml   Filed Weights   05/30/20 1214  Weight: 83.9 kg    Examination:  General exam: Appears calm and comfortable  Respiratory system: Diminished breath sounds globally. Respiratory effort normal. Cardiovascular system: S1 & S2 heard, RRR. No JVD, murmurs, rubs, gallops or clicks. No pedal edema. Gastrointestinal system: Abdomen is nondistended, soft and nontender. No organomegaly or masses felt. Normal bowel sounds heard. Central nervous system: Alert and oriented. No focal neurological deficits. Extremities: Symmetric 5 x 5 power. Skin: No rashes, lesions or ulcers.  Psychiatry: Judgement and insight appear normal. Mood & affect appropriate.   Data Reviewed: I have personally reviewed following labs and imaging studies  CBC: Recent Labs  Lab 05/30/20 1305 05/30/20 1755 06/01/20 0649  WBC 7.0 7.7 6.8  NEUTROABS  --   --  4.6  HGB 16.3 16.8 16.7  HCT 46.4 47.9 47.1  MCV 94.9 95.8 95.3  PLT 201 222 073   Basic Metabolic Panel: Recent Labs  Lab 05/30/20 1305 05/30/20 1755 06/01/20 0649  NA 133*  --  135  K 4.2  --  4.8  CL 96*  --  102  CO2 23  --  14*  GLUCOSE 208*  --  398*  BUN 11   --  24*  CREATININE 0.71 0.75 0.78  CALCIUM 8.0*  --  8.3*  MG  --   --  3.0*  PHOS  --   --  3.6   GFR: Estimated Creatinine Clearance: 119.2 mL/min (by C-G formula based on  SCr of 0.78 mg/dL). Liver Function Tests: Recent Labs  Lab 06/01/20 0649  AST 49*  ALT 44  ALKPHOS 117  BILITOT 1.3*  PROT 7.4  ALBUMIN 3.2*   No results for input(s): LIPASE, AMYLASE in the last 168 hours. No results for input(s): AMMONIA in the last 168 hours. Coagulation Profile: No results for input(s): INR, PROTIME in the last 168 hours. Cardiac Enzymes: No results for input(s): CKTOTAL, CKMB, CKMBINDEX, TROPONINI in the last 168 hours. BNP (last 3 results) No results for input(s): PROBNP in the last 8760 hours. HbA1C: No results for input(s): HGBA1C in the last 72 hours. CBG: No results for input(s): GLUCAP in the last 168 hours. Lipid Profile: Recent Labs    05/30/20 1755  TRIG 175*   Thyroid Function Tests: No results for input(s): TSH, T4TOTAL, FREET4, T3FREE, THYROIDAB in the last 72 hours. Anemia Panel: Recent Labs    05/30/20 1755 06/01/20 0649  FERRITIN 2,036* 1,966*   Sepsis Labs: Recent Labs  Lab 05/30/20 1755  PROCALCITON 0.30  LATICACIDVEN 2.4*    Recent Results (from the past 240 hour(s))  Culture, blood (Routine X 2) w Reflex to ID Panel     Status: None (Preliminary result)   Collection Time: 05/30/20  5:55 PM   Specimen: BLOOD  Result Value Ref Range Status   Specimen Description BLOOD LEFT ANTECUBITAL  Final   Special Requests   Final    BOTTLES DRAWN AEROBIC AND ANAEROBIC Blood Culture adequate volume   Culture   Final    NO GROWTH 2 DAYS Performed at [facility], [address], [place], [place] [address]    Report Status PENDING  Incomplete  Respiratory Panel by RT PCR (Flu A&B, Covid) - Nasopharyngeal Swab     Status: Abnormal   Collection Time: 05/30/20  5:55 PM   Specimen: Nasopharyngeal Swab  Result Value Ref Range Status   SARS  Coronavirus 2 by RT PCR POSITIVE (A) NEGATIVE Final    Comment: RESULT CALLED TO, READ BACK BY AND VERIFIED WITH: [facility] 05/30/20 AT 1922 BY ACR (NOTE) SARS-CoV-2 target nucleic acids are DETECTED.  SARS-CoV-2 RNA is generally detectable in upper respiratory specimens  during the acute phase of infection. Positive results are indicative of the presence of the identified virus, but do not rule out bacterial infection or co-infection with other pathogens not detected by the test. Clinical correlation with patient history and other diagnostic information is necessary to determine patient infection status. The expected result is Negative.  Fact Sheet for Patients:  PinkCheek.be  Fact Sheet for Healthcare Providers: GravelBags.it  This test is not yet approved or cleared by the [place] FDA and  has been authorized for detection and/or diagnosis of SARS-CoV-2 by FDA under an Emergency Use Authorization (EUA).  This EUA will remain in effect (meaning this test can  be used) for the duration of  the COVID-19 declaration under Section 564(b)(1) of the Act, 21 U.S.C. section 360bbb-3(b)(1), unless the authorization is terminated or revoked sooner.      Influenza A by PCR NEGATIVE NEGATIVE Final   Influenza B by PCR NEGATIVE NEGATIVE Final    Comment: (NOTE) The Xpert Xpress SARS-CoV-2/FLU/RSV assay is intended as an aid in  the diagnosis of influenza from Nasopharyngeal swab specimens and  should not be used as a sole basis for treatment. Nasal washings and  aspirates are unacceptable for Xpert Xpress SARS-CoV-2/FLU/RSV  testing.  Fact Sheet for Patients: PinkCheek.be  Fact Sheet for Healthcare  Providers: GravelBags.it  This test is not yet approved or cleared by the [place] and  has been authorized for detection and/or diagnosis of SARS-CoV-2 by   FDA under an Emergency Use Authorization (EUA). This EUA will remain  in effect (meaning this test can be used) for the duration of the  Covid-19 declaration under Section 564(b)(1) of the Act, 21  U.S.C. section 360bbb-3(b)(1), unless the authorization is  terminated or revoked. Performed at [facility], [address] [place], [place] [address]   Culture, blood (routine x 2)     Status: None (Preliminary result)   Collection Time: 05/30/20  6:20 PM   Specimen: BLOOD  Result Value Ref Range Status   Specimen Description BLOOD RIGTH [facility]  Final   Special Requests   Final    BOTTLES DRAWN AEROBIC AND ANAEROBIC Blood Culture adequate volume   Culture   Final    NO GROWTH 2 DAYS Performed at [facility], [address] [place], [place] [address]    Report Status PENDING  Incomplete      Radiology Studies: DG Chest 2 View  Result Date: 05/30/2020 CLINICAL DATA:  COVID EXAM: CHEST - 2 VIEW COMPARISON:  None. FINDINGS: The cardiomediastinal silhouette is mildly enlarged in contour. No pleural effusion. No pneumothorax. Multifocal bilateral peripheral predominant heterogeneous opacities, consistent with the sequela of COVID-19 infection. Visualized abdomen is unremarkable. No acute osseous abnormality noted. IMPRESSION: Multifocal bilateral peripheral predominant heterogeneous opacities, consistent with the sequela of COVID-19 infection. Recommend follow-up radiograph in 3 months to assess for resolution. Electronically Signed   By: Valentino Saxon MD   On: 05/30/2020 13:22    Scheduled Meds:  vitamin C  500 mg Oral Daily   baricitinib  4 mg Oral Daily   Ipratropium-Albuterol  1 puff Inhalation Q6H   methylPREDNISolone (SOLU-MEDROL) injection  0.5 mg/kg Intravenous Q12H   Followed by   Derrill Memo ON 06/03/2020] predniSONE  50 mg Oral Daily   sodium chloride flush  3 mL Intravenous Q12H   zinc sulfate  220 mg Oral Daily   Continuous Infusions:  remdesivir  100 mg in NS 100 mL Stopped (05/31/20 1224)     LOS: 2 days   Time spent: 30 minutes   Darliss Cheney, MD [facility]  06/01/2020, 10:52 AM   To contact the attending provider between 7A-7P or the covering provider during after hours 7P-7A, please log into the web site [email or web address].

## 2020-06-01 NOTE — Plan of Care (Signed)

## 2020-06-02 ENCOUNTER — Inpatient Hospital Stay: Payer: Medicaid Other

## 2020-06-02 DIAGNOSIS — J9600 Acute respiratory failure, unspecified whether with hypoxia or hypercapnia: Secondary | ICD-10-CM | POA: Diagnosis not present

## 2020-06-02 DIAGNOSIS — U071 COVID-19: Secondary | ICD-10-CM | POA: Diagnosis not present

## 2020-06-02 LAB — MAGNESIUM: Magnesium: 2.8 mg/dL — ABNORMAL HIGH (ref 1.7–2.4)

## 2020-06-02 LAB — CBC WITH DIFFERENTIAL/PLATELET
Abs Immature Granulocytes: 0.06 10*3/uL (ref 0.00–0.07)
Basophils Absolute: 0 10*3/uL (ref 0.0–0.1)
Basophils Relative: 0 %
Eosinophils Absolute: 0 10*3/uL (ref 0.0–0.5)
Eosinophils Relative: 0 %
HCT: 45.4 % (ref 39.0–52.0)
Hemoglobin: 16.5 g/dL (ref 13.0–17.0)
Immature Granulocytes: 1 %
Lymphocytes Relative: 22 %
Lymphs Abs: 1.6 10*3/uL (ref 0.7–4.0)
MCH: 33.3 pg (ref 26.0–34.0)
MCHC: 36.3 g/dL — ABNORMAL HIGH (ref 30.0–36.0)
MCV: 91.5 fL (ref 80.0–100.0)
Monocytes Absolute: 0.5 10*3/uL (ref 0.1–1.0)
Monocytes Relative: 7 %
Neutro Abs: 4.9 10*3/uL (ref 1.7–7.7)
Neutrophils Relative %: 70 %
Platelets: 349 10*3/uL (ref 150–400)
RBC: 4.96 MIL/uL (ref 4.22–5.81)
RDW: 11.9 % (ref 11.5–15.5)
WBC: 7.1 10*3/uL (ref 4.0–10.5)
nRBC: 0 % (ref 0.0–0.2)

## 2020-06-02 LAB — COMPREHENSIVE METABOLIC PANEL
ALT: 61 U/L — ABNORMAL HIGH (ref 0–44)
AST: 65 U/L — ABNORMAL HIGH (ref 15–41)
Albumin: 3.1 g/dL — ABNORMAL LOW (ref 3.5–5.0)
Alkaline Phosphatase: 117 U/L (ref 38–126)
Anion gap: 15 (ref 5–15)
BUN: 32 mg/dL — ABNORMAL HIGH (ref 6–20)
CO2: 17 mmol/L — ABNORMAL LOW (ref 22–32)
Calcium: 8.3 mg/dL — ABNORMAL LOW (ref 8.9–10.3)
Chloride: 101 mmol/L (ref 98–111)
Creatinine, Ser: 0.87 mg/dL (ref 0.61–1.24)
GFR calc Af Amer: >60 mL/min (ref >60)
GFR calc non Af Amer: >60 mL/min (ref >60)
Glucose, Bld: 441 mg/dL — ABNORMAL HIGH (ref 70–99)
Potassium: 4.9 mmol/L (ref 3.5–5.1)
Sodium: 133 mmol/L — ABNORMAL LOW (ref 135–145)
Total Bilirubin: 1.6 mg/dL — ABNORMAL HIGH (ref 0.3–1.2)
Total Protein: 7.5 g/dL (ref 6.5–8.1)

## 2020-06-02 LAB — PHOSPHORUS: Phosphorus: 4.3 mg/dL (ref 2.5–4.6)

## 2020-06-02 LAB — FIBRIN DERIVATIVES D-DIMER (ARMC ONLY): Fibrin derivatives D-dimer (ARMC): 1701.54 ng/mL (FEU) — ABNORMAL HIGH (ref 0.00–499.00)

## 2020-06-02 LAB — FERRITIN: Ferritin: 2098 ng/mL — ABNORMAL HIGH (ref 24–336)

## 2020-06-02 LAB — HEMOGLOBIN A1C
Hgb A1c MFr Bld: 10.7 % — ABNORMAL HIGH (ref 4.8–5.6)
Mean Plasma Glucose: 260.39 mg/dL

## 2020-06-02 LAB — GLUCOSE, CAPILLARY
Glucose-Capillary: 478 mg/dL — ABNORMAL HIGH (ref 70–99)
Glucose-Capillary: 403 mg/dL — ABNORMAL HIGH (ref 70–99)
Glucose-Capillary: 327 mg/dL — ABNORMAL HIGH (ref 70–99)
Glucose-Capillary: 342 mg/dL — ABNORMAL HIGH (ref 70–99)

## 2020-06-02 LAB — C-REACTIVE PROTEIN: CRP: 9.2 mg/dL — ABNORMAL HIGH (ref <1.0)

## 2020-06-02 LAB — C DIFFICILE QUICK SCREEN W PCR REFLEX
C Diff antigen: NEGATIVE
C Diff interpretation: NOT DETECTED
C Diff toxin: NEGATIVE

## 2020-06-02 MED ORDER — INSULIN ASPART 100 UNIT/ML ~~LOC~~ SOLN
0.0000 [IU] | Freq: Three times a day (TID) | SUBCUTANEOUS | Status: DC
  Administered 2020-06-02 (×2): 15 [IU] via SUBCUTANEOUS
  Administered 2020-06-02 – 2020-06-03 (×2): 11 [IU] via SUBCUTANEOUS
  Administered 2020-06-03 (×2): 15 [IU] via SUBCUTANEOUS
  Administered 2020-06-04 (×2): 11 [IU] via SUBCUTANEOUS
  Administered 2020-06-04 – 2020-06-05 (×2): 15 [IU] via SUBCUTANEOUS
  Administered 2020-06-05: 8 [IU] via SUBCUTANEOUS
  Administered 2020-06-05: 5 [IU] via SUBCUTANEOUS
  Administered 2020-06-06: 2 [IU] via SUBCUTANEOUS
  Administered 2020-06-06: 8 [IU] via SUBCUTANEOUS
  Administered 2020-06-06 – 2020-06-07 (×2): 5 [IU] via SUBCUTANEOUS
  Administered 2020-06-07: 3 [IU] via SUBCUTANEOUS
  Filled 2020-06-02 (×17): qty 1

## 2020-06-02 MED ORDER — ENOXAPARIN SODIUM 40 MG/0.4ML ~~LOC~~ SOLN
40.0000 mg | SUBCUTANEOUS | Status: DC
  Administered 2020-06-02 – 2020-06-06 (×5): 40 mg via SUBCUTANEOUS
  Filled 2020-06-02 (×5): qty 0.4

## 2020-06-02 MED ORDER — INSULIN ASPART 100 UNIT/ML ~~LOC~~ SOLN
0.0000 [IU] | Freq: Every day | SUBCUTANEOUS | Status: DC
  Administered 2020-06-02 – 2020-06-03 (×2): 5 [IU] via SUBCUTANEOUS
  Administered 2020-06-04 – 2020-06-06 (×3): 4 [IU] via SUBCUTANEOUS
  Filled 2020-06-02 (×5): qty 1

## 2020-06-02 NOTE — Progress Notes (Addendum)
PROGRESS NOTE    Steven Luna  OEV:[id number] DOB: 12-31-1970 DOA: 05/30/2020 PCP: Sharlet Salina, MD   Brief Narrative:  Steven Luna is a 49 y.o. male with medical history significant of chronic pain on opioids presented to ED with a complaint of shortness of breath, body aches, fever since little over 7 days.  According to patient,  he was tested positive for Covid about a week ago at mobile unit.  Patient continued to have progressive worsening of the symptoms with some nonproductive cough, loss of appetite and some mild intermittent chest pain so he came to ED.  Upon arrival to ED, he was febrile with temperature of 101.9, tachycardia and tachypneic and hypoxic 88% on room air requiring 2 L of oxygen.  CBC and BMP unremarkable.  2 sets of troponin negative.    He was tested positive for COVID-19 again with elevated noncommittal markers. Hospital service were consulted to admit the patient for further management.  He was started on IV Solu-Medrol and remdesivir.  Initially he was requiring 4 L of oxygen but overnight he required almost 10 to 12 L of high flow oxygen.  He was then started on baricitinib next morning.  Assessment & Plan:   Active Problems:   Chronic pain   Chronic low back pain (Location of Primary Source of Pain) (Bilateral) (L>R)   Pneumonia due to COVID-19 virus   Severe sepsis (HCC)   Acute respiratory failure with hypoxia (HCC)   Acute respiratory failure due to COVID-19 (HCC)   Severe sepsis and acute hypoxic respiratory failure secondary to COVID-19 pneumonia: Patient met severe sepsis criteria based on fever, tachycardia tachypnea and hypoxia and lactic acid of 2.4.  This is due to COVID-19 pneumonia.  Patient continues to state that he feels better but his oxygen demand is going higher every day.  Currently he is on 15 L high flow.  Chest x-ray shows increased opacities.  Slightly worsening inflammatory markers as well.  We will continue  following: Remdesivir, per pharmacy protocol Baricitinib IV Solu-Medrol, antitussive, bronchodilator, zinc, vitamin C.  Patient was encouraged to prone, out of bed to chair, to use incentive spirometry and flutter valve.  Diarrhea: Per RN, patient has had 3-4 bowel movements which are diarrhea.  Will check C. Difficile.  Hyperglycemia: Patient remains hyperglycemic.  No prior history of diabetes.  Will check hemoglobin A1c.  Start on SSI.  Chronic pain syndrome: Takes Percocet at home.  Continue that.  DVT prophylaxis:    Code Status: Full Code  Family Communication:  None present at bedside.  Plan of care discussed with patient in length and he verbalized understanding and agreed with it.  Called patient's daughter Verdis Frederickson at [phone]   Status is: Inpatient  Remains inpatient appropriate because:Hemodynamically unstable   Dispo: The patient is from: Home              Anticipated d/c is to: Home              Anticipated d/c date is: > 3 days              Patient currently is not medically stable to d/c.        Estimated body mass index is 29.13 kg/m as calculated from the following:   Height as of this encounter: 5' 8"  (1.727 m).   Weight as of this encounter: 86.9 kg.      Nutritional status:  Consultants:   None  Procedures:   None  Antimicrobials:  Anti-infectives (From admission, onward)   Start     Dose/Rate Route Frequency Ordered Stop   05/31/20 1000  remdesivir 100 mg in sodium chloride 0.9 % 100 mL IVPB       "Followed by" Linked Group Details   100 mg 200 mL/hr over 30 Minutes Intravenous Daily 05/30/20 1735 06/04/20 0959   05/31/20 1000  remdesivir 100 mg in sodium chloride 0.9 % 100 mL IVPB  Status:  Discontinued       "Followed by" Linked Group Details   100 mg 200 mL/hr over 30 Minutes Intravenous Daily 05/30/20 1746 05/30/20 1755   05/30/20 1830  remdesivir 200 mg in sodium chloride 0.9% 250 mL IVPB       "Followed  by" Linked Group Details   200 mg 580 mL/hr over 30 Minutes Intravenous Once 05/30/20 1735 05/30/20 2038   05/30/20 1800  remdesivir 200 mg in sodium chloride 0.9% 250 mL IVPB  Status:  Discontinued       "Followed by" Linked Group Details   200 mg 580 mL/hr over 30 Minutes Intravenous Once 05/30/20 1746 05/30/20 1755   05/30/20 1730  cefTRIAXone (ROCEPHIN) 2 g in sodium chloride 0.9 % 100 mL IVPB        2 g 200 mL/hr over 30 Minutes Intravenous  Once 05/30/20 1719 05/30/20 1841   05/30/20 1730  azithromycin (ZITHROMAX) 500 mg in sodium chloride 0.9 % 250 mL IVPB        500 mg 250 mL/hr over 60 Minutes Intravenous  Once 05/30/20 1719 05/30/20 1951         Subjective: Patient seen and examined.  He states that he feels better.  He is able to speak in full sentences and looks comfortable.  Objective: Vitals:   06/02/20 0800 06/02/20 1000 06/02/20 1125 06/02/20 1200  BP: 124/83 125/81  124/84  Pulse: (!) 102 (!) 101  95  Resp: (!) 29 (!) 29  (!) 22  Temp: 98 F (36.7 C)     TempSrc: Oral     SpO2: (!) 88% 91% 91% (!) 87%  Weight:      Height:        Intake/Output Summary (Last 24 hours) at 06/02/2020 1204 Last data filed at 06/02/2020 1028 Gross per 24 hour  Intake 243 ml  Output 5050 ml  Net -4807 ml   Filed Weights   05/30/20 1214 06/02/20 0420  Weight: 83.9 kg 86.9 kg    Examination:  General exam: Appears calm and comfortable  Respiratory system: Diminished breath sounds bilaterally. Respiratory effort normal. Cardiovascular system: S1 & S2 heard, RRR. No JVD, murmurs, rubs, gallops or clicks. No pedal edema. Gastrointestinal system: Abdomen is nondistended, soft and nontender. No organomegaly or masses felt. Normal bowel sounds heard. Central nervous system: Alert and oriented. No focal neurological deficits. Extremities: Symmetric 5 x 5 power. Skin: No rashes, lesions or ulcers.  Psychiatry: Judgement and insight appear normal. Mood & affect appropriate.    Data Reviewed: I have personally reviewed following labs and imaging studies  CBC: Recent Labs  Lab 05/30/20 1305 05/30/20 1755 06/01/20 0649 06/02/20 0333  WBC 7.0 7.7 6.8 7.1  NEUTROABS  --   --  4.6 4.9  HGB 16.3 16.8 16.7 16.5  HCT 46.4 47.9 47.1 45.4  MCV 94.9 95.8 95.3 91.5  PLT 201 222 337 062   Basic Metabolic Panel: Recent Labs  Lab 05/30/20 1305 05/30/20 1755  06/01/20 0649 06/02/20 0333  NA 133*  --  135 133*  K 4.2  --  4.8 4.9  CL 96*  --  102 101  CO2 23  --  14* 17*  GLUCOSE 208*  --  398* 441*  BUN 11  --  24* 32*  CREATININE 0.71 0.75 0.78 0.87  CALCIUM 8.0*  --  8.3* 8.3*  MG  --   --  3.0* 2.8*  PHOS  --   --  3.6 4.3   GFR: Estimated Creatinine Clearance: 111.3 mL/min (by C-G formula based on SCr of 0.87 mg/dL). Liver Function Tests: Recent Labs  Lab 06/01/20 0649 06/02/20 0333  AST 49* 65*  ALT 44 61*  ALKPHOS 117 117  BILITOT 1.3* 1.6*  PROT 7.4 7.5  ALBUMIN 3.2* 3.1*   No results for input(s): LIPASE, AMYLASE in the last 168 hours. No results for input(s): AMMONIA in the last 168 hours. Coagulation Profile: No results for input(s): INR, PROTIME in the last 168 hours. Cardiac Enzymes: No results for input(s): CKTOTAL, CKMB, CKMBINDEX, TROPONINI in the last 168 hours. BNP (last 3 results) No results for input(s): PROBNP in the last 8760 hours. HbA1C: No results for input(s): HGBA1C in the last 72 hours. CBG: Recent Labs  Lab 06/02/20 0829  GLUCAP 478*   Lipid Profile: Recent Labs    05/30/20 1755  TRIG 175*   Thyroid Function Tests: No results for input(s): TSH, T4TOTAL, FREET4, T3FREE, THYROIDAB in the last 72 hours. Anemia Panel: Recent Labs    06/01/20 0649 06/02/20 0333  FERRITIN 1,966* 2,098*   Sepsis Labs: Recent Labs  Lab 05/30/20 1755  PROCALCITON 0.30  LATICACIDVEN 2.4*    Recent Results (from the past 240 hour(s))  Culture, blood (Routine X 2) w Reflex to ID Panel     Status: None (Preliminary  result)   Collection Time: 05/30/20  5:55 PM   Specimen: BLOOD  Result Value Ref Range Status   Specimen Description BLOOD LEFT ANTECUBITAL  Final   Special Requests   Final    BOTTLES DRAWN AEROBIC AND ANAEROBIC Blood Culture adequate volume   Culture   Final    NO GROWTH 3 DAYS Performed at [facility], [address], [place], [place] [address]    Report Status PENDING  Incomplete  Respiratory Panel by RT PCR (Flu A&B, Covid) - Nasopharyngeal Swab     Status: Abnormal   Collection Time: 05/30/20  5:55 PM   Specimen: Nasopharyngeal Swab  Result Value Ref Range Status   SARS Coronavirus 2 by RT PCR POSITIVE (A) NEGATIVE Final    Comment: RESULT CALLED TO, READ BACK BY AND VERIFIED WITH: [facility] 05/30/20 AT 1922 BY ACR (NOTE) SARS-CoV-2 target nucleic acids are DETECTED.  SARS-CoV-2 RNA is generally detectable in upper respiratory specimens  during the acute phase of infection. Positive results are indicative of the presence of the identified virus, but do not rule out bacterial infection or co-infection with other pathogens not detected by the test. Clinical correlation with patient history and other diagnostic information is necessary to determine patient infection status. The expected result is Negative.  Fact Sheet for Patients:  PinkCheek.be  Fact Sheet for Healthcare Providers: GravelBags.it  This test is not yet approved or cleared by the [place] FDA and  has been authorized for detection and/or diagnosis of SARS-CoV-2 by FDA under an Emergency Use Authorization (EUA).  This EUA will remain in effect (meaning this test can  be used)  for the duration of  the COVID-19 declaration under Section 564(b)(1) of the Act, 21 U.S.C. section 360bbb-3(b)(1), unless the authorization is terminated or revoked sooner.      Influenza A by PCR NEGATIVE NEGATIVE Final   Influenza B by PCR NEGATIVE  NEGATIVE Final    Comment: (NOTE) The Xpert Xpress SARS-CoV-2/FLU/RSV assay is intended as an aid in  the diagnosis of influenza from Nasopharyngeal swab specimens and  should not be used as a sole basis for treatment. Nasal washings and  aspirates are unacceptable for Xpert Xpress SARS-CoV-2/FLU/RSV  testing.  Fact Sheet for Patients: PinkCheek.be  Fact Sheet for Healthcare Providers: GravelBags.it  This test is not yet approved or cleared by the [place] FDA and  has been authorized for detection and/or diagnosis of SARS-CoV-2 by  FDA under an Emergency Use Authorization (EUA). This EUA will remain  in effect (meaning this test can be used) for the duration of the  Covid-19 declaration under Section 564(b)(1) of the Act, 21  U.S.C. section 360bbb-3(b)(1), unless the authorization is  terminated or revoked. Performed at [facility], [address] [place], [place] [address]   Culture, blood (routine x 2)     Status: None (Preliminary result)   Collection Time: 05/30/20  6:20 PM   Specimen: BLOOD  Result Value Ref Range Status   Specimen Description BLOOD RIGTH [facility]  Final   Special Requests   Final    BOTTLES DRAWN AEROBIC AND ANAEROBIC Blood Culture adequate volume   Culture   Final    NO GROWTH 3 DAYS Performed at [facility], [address], [place], [place] [address]    Report Status PENDING  Incomplete      Radiology Studies: DG Chest Port 1 View  Result Date: 06/02/2020 CLINICAL DATA:  COVID EXAM: PORTABLE CHEST 1 VIEW COMPARISON:  May 30, 2020 FINDINGS: The cardiomediastinal silhouette is unchanged in contour.Low lung volumes. No pleural effusion. No pneumothorax. Diffuse bilateral peripheral predominant heterogeneous opacities, consistent with the sequela of COVID-19 infection. These are increased in conspicuity in comparison to prior, likely due to differences in technique.  Visualized abdomen is unremarkable. No acute osseous abnormality. IMPRESSION: Diffuse bilateral peripheral predominant heterogeneous opacities, consistent with the sequela of COVID-19 infection. These are increased in conspicuity, most likely due to differences in technique. Electronically Signed   By: Valentino Saxon MD   On: 06/02/2020 09:08    Scheduled Meds: . vitamin C  500 mg Oral Daily  . baricitinib  4 mg Oral Daily  . enoxaparin (LOVENOX) injection  40 mg Subcutaneous Q24H  . insulin aspart  0-15 Units Subcutaneous TID WC  . insulin aspart  0-5 Units Subcutaneous QHS  . Ipratropium-Albuterol  1 puff Inhalation Q6H  . methylPREDNISolone (SOLU-MEDROL) injection  0.5 mg/kg Intravenous Q12H   Followed by  . [START ON 06/03/2020] predniSONE  50 mg Oral Daily  . sodium chloride flush  3 mL Intravenous Q12H  . zinc sulfate  220 mg Oral Daily   Continuous Infusions: . remdesivir 100 mg in NS 100 mL 100 mg (06/02/20 1032)     LOS: 3 days   Time spent: 29 minutes   Darliss Cheney, MD [facility]  06/02/2020, 12:04 PM   To contact the attending provider between 7A-7P or the covering provider during after hours 7P-7A, please log into the web site [email or web address].

## 2020-06-02 NOTE — Consult Note (Addendum)
ANTICOAGULATION CONSULT NOTE  Pharmacy Consult for Enoxaparin Indication: VTE prophylaxis  No Known Allergies  Patient Measurements: Height: 5\' 8"  (172.7 cm) Weight: 86.9 kg (191 lb 9.6 oz) IBW/kg (Calculated) : 68.4  Vital Signs: Temp: 97.9 F (36.6 C) (09/19 0420) Temp Source: Oral (09/19 0420) BP: 124/83 (09/19 0600) Pulse Rate: 99 (09/19 0631)  Labs: Recent Labs    05/30/20 1305 05/30/20 1305 05/30/20 1414 05/30/20 1755 05/30/20 1755 06/01/20 0649 06/02/20 0333  HGB 16.3   < >  --  16.8   < > 16.7 16.5  HCT 46.4   < >  --  47.9  --  47.1 45.4  PLT 201   < >  --  222  --  337 349  CREATININE 0.71   < >  --  0.75  --  0.78 0.87  TROPONINIHS 10  --  11  --   --   --   --    < > = values in this interval not displayed.    Estimated Creatinine Clearance: 111.3 mL/min (by C-G formula based on SCr of 0.87 mg/dL).   Medical History: Past Medical History:  Diagnosis Date  . Lumbago of lumbar region with sciatica 02/23/2017  . Opiate use (22.5 MME/Day) 06/29/2016    Medications:  Per chart review, patient has not PTA anticoagulation  Assessment: 49yo male with PMH of chronic pain on opioids who presented to the ED c/p SOB, body aches, and fever for over 7 days. Patient tested positive for COVID about a week ago at mobile unit. Symptoms worsened with some nonproductive cough, loss of appetitie, and mild intermittent CP. Pharmacy has been consulted for enoxaparin dosing for VTE prophylaxis.  Baseline Hgb 16.5, Plt 349  Goal of Therapy:  Monitor platelets by anticoagulation protocol: Yes   Plan:  Start Lovenox 40mg  [place] Q24 hours Monitor renal function and adjust dose as clinically indicated Monitor CBC daily with AM labs  49yo, PharmD Pharmacy Resident  06/02/2020 8:15 AM

## 2020-06-02 NOTE — Progress Notes (Signed)
Rt called to room for pt desat into low 80's. Pt placed on HHFNC 40L60% with sat in mid 90's. Will continue to monitor closely

## 2020-06-03 DIAGNOSIS — R6520 Severe sepsis without septic shock: Secondary | ICD-10-CM | POA: Diagnosis not present

## 2020-06-03 DIAGNOSIS — U071 COVID-19: Secondary | ICD-10-CM | POA: Diagnosis not present

## 2020-06-03 DIAGNOSIS — A419 Sepsis, unspecified organism: Secondary | ICD-10-CM | POA: Diagnosis not present

## 2020-06-03 DIAGNOSIS — J9600 Acute respiratory failure, unspecified whether with hypoxia or hypercapnia: Secondary | ICD-10-CM | POA: Diagnosis not present

## 2020-06-03 LAB — CBC WITH DIFFERENTIAL/PLATELET
Abs Immature Granulocytes: 0.07 10*3/uL (ref 0.00–0.07)
Basophils Absolute: 0 10*3/uL (ref 0.0–0.1)
Basophils Relative: 0 %
Eosinophils Absolute: 0 10*3/uL (ref 0.0–0.5)
Eosinophils Relative: 0 %
HCT: 45.6 % (ref 39.0–52.0)
Hemoglobin: 17.1 g/dL — ABNORMAL HIGH (ref 13.0–17.0)
Immature Granulocytes: 1 %
Lymphocytes Relative: 17 %
Lymphs Abs: 1.7 10*3/uL (ref 0.7–4.0)
MCH: 33.7 pg (ref 26.0–34.0)
MCHC: 37.5 g/dL — ABNORMAL HIGH (ref 30.0–36.0)
MCV: 89.8 fL (ref 80.0–100.0)
Monocytes Absolute: 1.1 10*3/uL — ABNORMAL HIGH (ref 0.1–1.0)
Monocytes Relative: 11 %
Neutro Abs: 7.3 10*3/uL (ref 1.7–7.7)
Neutrophils Relative %: 71 %
Platelets: 286 10*3/uL (ref 150–400)
RBC: 5.08 MIL/uL (ref 4.22–5.81)
RDW: 11.9 % (ref 11.5–15.5)
Smear Review: NORMAL
WBC: 10.2 10*3/uL (ref 4.0–10.5)
nRBC: 0 % (ref 0.0–0.2)

## 2020-06-03 LAB — MAGNESIUM: Magnesium: 2.9 mg/dL — ABNORMAL HIGH (ref 1.7–2.4)

## 2020-06-03 LAB — GLUCOSE, CAPILLARY
Glucose-Capillary: 310 mg/dL — ABNORMAL HIGH (ref 70–99)
Glucose-Capillary: 420 mg/dL — ABNORMAL HIGH (ref 70–99)
Glucose-Capillary: 371 mg/dL — ABNORMAL HIGH (ref 70–99)
Glucose-Capillary: 395 mg/dL — ABNORMAL HIGH (ref 70–99)

## 2020-06-03 LAB — C-REACTIVE PROTEIN: CRP: 4 mg/dL — ABNORMAL HIGH (ref <1.0)

## 2020-06-03 LAB — COMPREHENSIVE METABOLIC PANEL
ALT: 49 U/L — ABNORMAL HIGH (ref 0–44)
AST: 38 U/L (ref 15–41)
Albumin: 3.2 g/dL — ABNORMAL LOW (ref 3.5–5.0)
Alkaline Phosphatase: 106 U/L (ref 38–126)
Anion gap: 15 (ref 5–15)
BUN: 34 mg/dL — ABNORMAL HIGH (ref 6–20)
CO2: 18 mmol/L — ABNORMAL LOW (ref 22–32)
Calcium: 8.2 mg/dL — ABNORMAL LOW (ref 8.9–10.3)
Chloride: 100 mmol/L (ref 98–111)
Creatinine, Ser: 0.61 mg/dL (ref 0.61–1.24)
GFR calc Af Amer: >60 mL/min (ref >60)
GFR calc non Af Amer: >60 mL/min (ref >60)
Glucose, Bld: 343 mg/dL — ABNORMAL HIGH (ref 70–99)
Potassium: 4.7 mmol/L (ref 3.5–5.1)
Sodium: 133 mmol/L — ABNORMAL LOW (ref 135–145)
Total Bilirubin: 1.2 mg/dL (ref 0.3–1.2)
Total Protein: 7.2 g/dL (ref 6.5–8.1)

## 2020-06-03 LAB — PHOSPHORUS: Phosphorus: 4.3 mg/dL (ref 2.5–4.6)

## 2020-06-03 LAB — FERRITIN: Ferritin: 1838 ng/mL — ABNORMAL HIGH (ref 24–336)

## 2020-06-03 LAB — FIBRIN DERIVATIVES D-DIMER (ARMC ONLY): Fibrin derivatives D-dimer (ARMC): 2268.35 ng/mL (FEU) — ABNORMAL HIGH (ref 0.00–499.00)

## 2020-06-03 MED ORDER — INSULIN ASPART 100 UNIT/ML ~~LOC~~ SOLN
3.0000 [IU] | Freq: Three times a day (TID) | SUBCUTANEOUS | Status: DC
  Administered 2020-06-03 – 2020-06-07 (×13): 3 [IU] via SUBCUTANEOUS
  Filled 2020-06-03 (×13): qty 1

## 2020-06-03 MED ORDER — INSULIN GLARGINE 100 UNIT/ML ~~LOC~~ SOLN
25.0000 [IU] | Freq: Every day | SUBCUTANEOUS | Status: DC
  Administered 2020-06-03 – 2020-06-04 (×2): 25 [IU] via SUBCUTANEOUS
  Filled 2020-06-03 (×2): qty 0.25

## 2020-06-03 MED ORDER — FUROSEMIDE 10 MG/ML IJ SOLN
40.0000 mg | Freq: Once | INTRAMUSCULAR | Status: AC
  Administered 2020-06-03: 40 mg via INTRAVENOUS
  Filled 2020-06-03: qty 4

## 2020-06-03 MED ORDER — LIVING WELL WITH DIABETES BOOK - IN SPANISH
Freq: Once | Status: AC
  Filled 2020-06-03: qty 1

## 2020-06-03 NOTE — Progress Notes (Signed)
Inpatient Diabetes Program Recommendations  AACE/ADA: New Consensus Statement on Inpatient Glycemic Control (2015)  Target Ranges:  Prepandial:   less than 140 mg/dL      Peak postprandial:   less than 180 mg/dL (1-2 hours)      Critically ill patients:  140 - 180 mg/dL   Lab Results  Component Value Date   GLUCAP 310 (H) 06/03/2020   HGBA1C 10.7 (H) 06/02/2020    Review of Glycemic Control  Diabetes history: New Diagnosis  Current orders for Inpatient glycemic control:  Lantus 25 units Daily Novolog 0-15 units tid + hs Novolog 3 units tid  PO prednisone 50 mg Daily A1c 10.7% on 9/19 after 5 doses of steroids.  Inpatient Diabetes Program Recommendations:    - Add Tradjenta 5 mg Daily (shown to reduce mortality)  Note new diabetes diagnosis this admission. Currently on HFNC 40L. Will speak with pt when more medically stable. No insurance, Will need follow up and medication management assistance for meds. Will order Living well with Diabetes educational booklet in spanish for pt.   Thanks,  Christena Deem RN, MSN, BC-ADM Inpatient Diabetes Coordinator Team Pager [phone] (8a-5p)

## 2020-06-03 NOTE — Progress Notes (Signed)
PROGRESS NOTE    Steven Luna  YPP:[id number] DOB: [birth year] DOA: 05/30/2020 PCP: Sharlet Salina, MD   Brief Narrative:  Steven Luna is a 49 y.o. male with medical history significant of chronic pain on opioids presented to ED with a complaint of shortness of breath, body aches, fever since little over 7 days.  According to patient,  he was tested positive for Covid about a week ago at mobile unit.  Patient continued to have progressive worsening of the symptoms with some nonproductive cough, loss of appetite and some mild intermittent chest pain so he came to ED.  Upon arrival to ED, he was febrile with temperature of 101.9, tachycardia and tachypneic and hypoxic 88% on room air requiring 2 L of oxygen.  CBC and BMP unremarkable.  2 sets of troponin negative.    He was tested positive for COVID-19 again with elevated noncommittal markers. Hospital service were consulted to admit the patient for further management.  He was started on IV Solu-Medrol and remdesivir.  Initially he was requiring 4 L of oxygen but overnight he required almost 10 to 12 L of high flow oxygen.  He was then started on baricitinib next morning.  Assessment & Plan:   Active Problems:   Chronic pain   Chronic low back pain (Location of Primary Source of Pain) (Bilateral) (L>R)   Pneumonia due to COVID-19 virus   Severe sepsis (HCC)   Acute respiratory failure with hypoxia (HCC)   Acute respiratory failure due to COVID-19 (HCC)   Severe sepsis and acute hypoxic respiratory failure secondary to COVID-19 pneumonia: Patient met severe sepsis criteria based on fever, tachycardia tachypnea and hypoxia and lactic acid of 2.4.  This is due to COVID-19 pneumonia.  Patient continues to state that he feels better but his oxygen demand is going higher every day.  He required 100% FiO2 with 40 L yesterday but currently he is down to 60% FiO2.  Currently he is on 15 L high flow.  Chest x-ray shows increased opacities.   Slightly worsening inflammatory markers as well.  We will continue following: Remdesivir, per pharmacy protocol Baricitinib IV Solu-Medrol, antitussive, bronchodilator, zinc, vitamin C.  Patient was encouraged to prone, out of bed to chair, to use incentive spirometry and flutter valve.  Diarrhea: C. difficile ruled out.  Diarrhea improved.  Newly diagnosed uncontrolled type 2 diabetes mellitus with hyperglycemia: Hemoglobin A1c 10.3.  New diagnosis of type 2 diabetes mellitus.  Blood sugar in 300 range.  Will start on Lantus 25 units along with NovoLog premeal 3 units 3 times daily and continue SSI.  Chronic pain syndrome: Takes Percocet at home.  Continue that.  DVT prophylaxis:    Code Status: Full Code  Family Communication:  None present at bedside.  Plan of care discussed with patient in length and he verbalized understanding and agreed with it.  Called patient's daughter Verdis Frederickson at [phone] yesterday.   Status is: Inpatient  Remains inpatient appropriate because:Hemodynamically unstable   Dispo: The patient is from: Home              Anticipated d/c is to: Home              Anticipated d/c date is: > 3 days              Patient currently is not medically stable to d/c.        Estimated body mass index is 28.48 kg/m as calculated from the following:   Height as of  this encounter: _0  (1.727 m).   Weight as of this encounter: 85 kg.      Nutritional status:               Consultants:   None  Procedures:   None  Antimicrobials:  Anti-infectives (From admission, onward)   Start     Dose/Rate Route Frequency Ordered Stop   05/31/20 1000  remdesivir 100 mg in sodium chloride 0.9 % 100 mL IVPB       "Followed by" Linked Group Details   100 mg 200 mL/hr over 30 Minutes Intravenous Daily 05/30/20 1735 06/03/20 1030   05/31/20 1000  remdesivir 100 mg in sodium chloride 0.9 % 100 mL IVPB  Status:  Discontinued       "Followed by" Linked Group  Details   100 mg 200 mL/hr over 30 Minutes Intravenous Daily 05/30/20 1746 05/30/20 1755   05/30/20 1830  remdesivir 200 mg in sodium chloride 0.9% 250 mL IVPB       "Followed by" Linked Group Details   200 mg 580 mL/hr over 30 Minutes Intravenous Once 05/30/20 1735 05/30/20 2038   05/30/20 1800  remdesivir 200 mg in sodium chloride 0.9% 250 mL IVPB  Status:  Discontinued       "Followed by" Linked Group Details   200 mg 580 mL/hr over 30 Minutes Intravenous Once 05/30/20 1746 05/30/20 1755   05/30/20 1730  cefTRIAXone (ROCEPHIN) 2 g in sodium chloride 0.9 % 100 mL IVPB        2 g 200 mL/hr over 30 Minutes Intravenous  Once 05/30/20 1719 05/30/20 1841   05/30/20 1730  azithromycin (ZITHROMAX) 500 mg in sodium chloride 0.9 % 250 mL IVPB        500 mg 250 mL/hr over 60 Minutes Intravenous  Once 05/30/20 1719 05/30/20 1951         Subjective: Seen and examined.  Despite of requiring higher amount of oxygen, he looks comfortable and he states that his breathing is better to the point that he was asking when he can go home.  Objective: Vitals:   06/03/20 0400 06/03/20 0534 06/03/20 0600 06/03/20 0800  BP: 117/80 (!) 125/92 119/73 105/66  Pulse: 80 79 79 81  Resp: 15 (!) 22 20 (!) 21  Temp:  98.1 F (36.7 C)  98 F (36.7 C)  TempSrc:  Oral  Oral  SpO2: 96% 98% 98% 91%  Weight:  85 kg    Height:        Intake/Output Summary (Last 24 hours) at 06/03/2020 1121 Last data filed at 06/03/2020 0959 Gross per 24 hour  Intake 3 ml  Output 2400 ml  Net -2397 ml   Filed Weights   05/30/20 1214 06/02/20 0420 06/03/20 0534  Weight: 83.9 kg 86.9 kg 85 kg    Examination:  General exam: Appears calm and comfortable  Respiratory system: Diminished breath sounds bilaterally. Respiratory effort normal. Cardiovascular system: S1 & S2 heard, RRR. No JVD, murmurs, rubs, gallops or clicks. No pedal edema. Gastrointestinal system: Abdomen is nondistended, soft and nontender. No organomegaly  or masses felt. Normal bowel sounds heard. Central nervous system: Alert and oriented. No focal neurological deficits. Extremities: Symmetric 5 x 5 power. Skin: No rashes, lesions or ulcers.  Psychiatry: Judgement and insight appear normal. Mood & affect appropriate.    Data Reviewed: I have personally reviewed following labs and imaging studies  CBC: Recent Labs  Lab 05/30/20 1305 05/30/20 1755 06/01/20 0649 06/02/20 0333 06/03/20  0748  WBC 7.0 7.7 6.8 7.1 10.2  NEUTROABS  --   --  4.6 4.9 7.3  HGB 16.3 16.8 16.7 16.5 17.1*  HCT 46.4 47.9 47.1 45.4 45.6  MCV 94.9 95.8 95.3 91.5 89.8  PLT 201 222 337 349 623   Basic Metabolic Panel: Recent Labs  Lab 05/30/20 1305 05/30/20 1755 06/01/20 0649 06/02/20 0333 06/03/20 0748  NA 133*  --  135 133* 133*  K 4.2  --  4.8 4.9 4.7  CL 96*  --  102 101 100  CO2 23  --  14* 17* 18*  GLUCOSE 208*  --  398* 441* 343*  BUN 11  --  24* 32* 34*  CREATININE 0.71 0.75 0.78 0.87 0.61  CALCIUM 8.0*  --  8.3* 8.3* 8.2*  MG  --   --  3.0* 2.8* 2.9*  PHOS  --   --  3.6 4.3 4.3   GFR: Estimated Creatinine Clearance: 119.8 mL/min (by C-G formula based on SCr of 0.61 mg/dL). Liver Function Tests: Recent Labs  Lab 06/01/20 0649 06/02/20 0333 06/03/20 0748  AST 49* 65* 38  ALT 44 61* 49*  ALKPHOS 117 117 106  BILITOT 1.3* 1.6* 1.2  PROT 7.4 7.5 7.2  ALBUMIN 3.2* 3.1* 3.2*   No results for input(s): LIPASE, AMYLASE in the last 168 hours. No results for input(s): AMMONIA in the last 168 hours. Coagulation Profile: No results for input(s): INR, PROTIME in the last 168 hours. Cardiac Enzymes: No results for input(s): CKTOTAL, CKMB, CKMBINDEX, TROPONINI in the last 168 hours. BNP (last 3 results) No results for input(s): PROBNP in the last 8760 hours. HbA1C: Recent Labs    06/02/20 0333  HGBA1C 10.7*   CBG: Recent Labs  Lab 06/02/20 0829 06/02/20 1220 06/02/20 1720 06/02/20 1947 06/03/20 0847  GLUCAP 478* 403* 327* 342*  310*   Lipid Profile: No results for input(s): CHOL, HDL, LDLCALC, TRIG, CHOLHDL, LDLDIRECT in the last 72 hours. Thyroid Function Tests: No results for input(s): TSH, T4TOTAL, FREET4, T3FREE, THYROIDAB in the last 72 hours. Anemia Panel: Recent Labs    06/02/20 0333 06/03/20 0748  FERRITIN 2,098* 1,838*   Sepsis Labs: Recent Labs  Lab 05/30/20 1755  PROCALCITON 0.30  LATICACIDVEN 2.4*    Recent Results (from the past 240 hour(s))  Culture, blood (Routine X 2) w Reflex to ID Panel     Status: None (Preliminary result)   Collection Time: 05/30/20  5:55 PM   Specimen: BLOOD  Result Value Ref Range Status   Specimen Description BLOOD LEFT ANTECUBITAL  Final   Special Requests   Final    BOTTLES DRAWN AEROBIC AND ANAEROBIC Blood Culture adequate volume   Culture   Final    NO GROWTH 4 DAYS Performed at [facility], [address], [place], [place] [address]    Report Status PENDING  Incomplete  Respiratory Panel by RT PCR (Flu A&B, Covid) - Nasopharyngeal Swab     Status: Abnormal   Collection Time: 05/30/20  5:55 PM   Specimen: Nasopharyngeal Swab  Result Value Ref Range Status   SARS Coronavirus 2 by RT PCR POSITIVE (A) NEGATIVE Final    Comment: RESULT CALLED TO, READ BACK BY AND VERIFIED WITH: [facility] 05/30/20 AT 1922 BY ACR (NOTE) SARS-CoV-2 target nucleic acids are DETECTED.  SARS-CoV-2 RNA is generally detectable in upper respiratory specimens  during the acute phase of infection. Positive results are indicative of the presence of the identified virus, but do  not rule out bacterial infection or co-infection with other pathogens not detected by the test. Clinical correlation with patient history and other diagnostic information is necessary to determine patient infection status. The expected result is Negative.  Fact Sheet for Patients:  PinkCheek.be  Fact Sheet for Healthcare  Providers: GravelBags.it  This test is not yet approved or cleared by the [place] FDA and  has been authorized for detection and/or diagnosis of SARS-CoV-2 by FDA under an Emergency Use Authorization (EUA).  This EUA will remain in effect (meaning this test can  be used) for the duration of  the COVID-19 declaration under Section 564(b)(1) of the Act, 21 U.S.C. section 360bbb-3(b)(1), unless the authorization is terminated or revoked sooner.      Influenza A by PCR NEGATIVE NEGATIVE Final   Influenza B by PCR NEGATIVE NEGATIVE Final    Comment: (NOTE) The Xpert Xpress SARS-CoV-2/FLU/RSV assay is intended as an aid in  the diagnosis of influenza from Nasopharyngeal swab specimens and  should not be used as a sole basis for treatment. Nasal washings and  aspirates are unacceptable for Xpert Xpress SARS-CoV-2/FLU/RSV  testing.  Fact Sheet for Patients: PinkCheek.be  Fact Sheet for Healthcare Providers: GravelBags.it  This test is not yet approved or cleared by the [place] FDA and  has been authorized for detection and/or diagnosis of SARS-CoV-2 by  FDA under an Emergency Use Authorization (EUA). This EUA will remain  in effect (meaning this test can be used) for the duration of the  Covid-19 declaration under Section 564(b)(1) of the Act, 21  U.S.C. section 360bbb-3(b)(1), unless the authorization is  terminated or revoked. Performed at [facility], [address] [place], [place] [address]   Culture, blood (routine x 2)     Status: None (Preliminary result)   Collection Time: 05/30/20  6:20 PM   Specimen: BLOOD  Result Value Ref Range Status   Specimen Description BLOOD RIGTH [facility]  Final   Special Requests   Final    BOTTLES DRAWN AEROBIC AND ANAEROBIC Blood Culture adequate volume   Culture   Final    NO GROWTH 4 DAYS Performed at [facility], [address], [place], [place] [address]    Report Status PENDING  Incomplete  C Difficile Quick Screen w PCR reflex     Status: None   Collection Time: 06/02/20 10:30 AM   Specimen: STOOL  Result Value Ref Range Status   C Diff antigen NEGATIVE NEGATIVE Final   C Diff toxin NEGATIVE NEGATIVE Final   C Diff interpretation No C. difficile detected.  Final    Comment: Performed at [facility], [address], [place], [place] [address]      Radiology Studies: DG Chest [place] 1 View  Result Date: 06/02/2020 CLINICAL DATA:  COVID EXAM: PORTABLE CHEST 1 VIEW COMPARISON:  May 30, 2020 FINDINGS: The cardiomediastinal silhouette is unchanged in contour.Low lung volumes. No pleural effusion. No pneumothorax. Diffuse bilateral peripheral predominant heterogeneous opacities, consistent with the sequela of COVID-19 infection. These are increased in conspicuity in comparison to prior, likely due to differences in technique. Visualized abdomen is unremarkable. No acute osseous abnormality. IMPRESSION: Diffuse bilateral peripheral predominant heterogeneous opacities, consistent with the sequela of COVID-19 infection. These are increased in conspicuity, most likely due to differences in technique. Electronically Signed   By: Valentino Saxon MD   On: 06/02/2020 09:08    Scheduled Meds: . vitamin C  500 mg Oral Daily  . baricitinib  4 mg Oral Daily  . enoxaparin (LOVENOX) injection  40 mg Subcutaneous Q24H  . insulin aspart  0-15 Units Subcutaneous TID WC  . insulin aspart  0-5 Units Subcutaneous QHS  . Ipratropium-Albuterol  1 puff Inhalation Q6H  . predniSONE  50 mg Oral Daily  . sodium chloride flush  3 mL Intravenous Q12H  . zinc sulfate  220 mg Oral Daily   Continuous Infusions:    LOS: 4 days   Time spent: 30 minutes   Darliss Cheney, MD [facility]  06/03/2020, 11:21 AM   To contact the attending provider between 7A-7P or the covering provider during after hours 7P-7A,  please log into the web site [email or web address].

## 2020-06-04 DIAGNOSIS — U071 COVID-19: Secondary | ICD-10-CM | POA: Diagnosis not present

## 2020-06-04 DIAGNOSIS — J9601 Acute respiratory failure with hypoxia: Secondary | ICD-10-CM | POA: Diagnosis not present

## 2020-06-04 DIAGNOSIS — J9600 Acute respiratory failure, unspecified whether with hypoxia or hypercapnia: Secondary | ICD-10-CM | POA: Diagnosis not present

## 2020-06-04 LAB — COMPREHENSIVE METABOLIC PANEL
ALT: 53 U/L — ABNORMAL HIGH (ref 0–44)
AST: 41 U/L (ref 15–41)
Albumin: 3.1 g/dL — ABNORMAL LOW (ref 3.5–5.0)
Alkaline Phosphatase: 98 U/L (ref 38–126)
Anion gap: 11 (ref 5–15)
BUN: 29 mg/dL — ABNORMAL HIGH (ref 6–20)
CO2: 23 mmol/L (ref 22–32)
Calcium: 8.4 mg/dL — ABNORMAL LOW (ref 8.9–10.3)
Chloride: 100 mmol/L (ref 98–111)
Creatinine, Ser: 0.6 mg/dL — ABNORMAL LOW (ref 0.61–1.24)
GFR calc Af Amer: >60 mL/min (ref >60)
GFR calc non Af Amer: >60 mL/min (ref >60)
Glucose, Bld: 267 mg/dL — ABNORMAL HIGH (ref 70–99)
Potassium: 3.9 mmol/L (ref 3.5–5.1)
Sodium: 134 mmol/L — ABNORMAL LOW (ref 135–145)
Total Bilirubin: 1.1 mg/dL (ref 0.3–1.2)
Total Protein: 6.8 g/dL (ref 6.5–8.1)

## 2020-06-04 LAB — CBC WITH DIFFERENTIAL/PLATELET
Abs Immature Granulocytes: 0.06 10*3/uL (ref 0.00–0.07)
Basophils Absolute: 0 10*3/uL (ref 0.0–0.1)
Basophils Relative: 0 %
Eosinophils Absolute: 0 10*3/uL (ref 0.0–0.5)
Eosinophils Relative: 0 %
HCT: 46.6 % (ref 39.0–52.0)
Hemoglobin: 17.3 g/dL — ABNORMAL HIGH (ref 13.0–17.0)
Immature Granulocytes: 1 %
Lymphocytes Relative: 22 %
Lymphs Abs: 2.2 10*3/uL (ref 0.7–4.0)
MCH: 34 pg (ref 26.0–34.0)
MCHC: 37.1 g/dL — ABNORMAL HIGH (ref 30.0–36.0)
MCV: 91.6 fL (ref 80.0–100.0)
Monocytes Absolute: 1 10*3/uL (ref 0.1–1.0)
Monocytes Relative: 10 %
Neutro Abs: 6.8 10*3/uL (ref 1.7–7.7)
Neutrophils Relative %: 67 %
Platelets: 248 10*3/uL (ref 150–400)
RBC: 5.09 MIL/uL (ref 4.22–5.81)
RDW: 11.9 % (ref 11.5–15.5)
Smear Review: UNDETERMINED
WBC: 10 10*3/uL (ref 4.0–10.5)
nRBC: 0 % (ref 0.0–0.2)

## 2020-06-04 LAB — GLUCOSE, CAPILLARY
Glucose-Capillary: 304 mg/dL — ABNORMAL HIGH (ref 70–99)
Glucose-Capillary: 371 mg/dL — ABNORMAL HIGH (ref 70–99)
Glucose-Capillary: 370 mg/dL — ABNORMAL HIGH (ref 70–99)
Glucose-Capillary: 339 mg/dL — ABNORMAL HIGH (ref 70–99)

## 2020-06-04 LAB — MAGNESIUM: Magnesium: 2.7 mg/dL — ABNORMAL HIGH (ref 1.7–2.4)

## 2020-06-04 LAB — CULTURE, BLOOD (ROUTINE X 2)
Culture: NO GROWTH
Culture: NO GROWTH
Special Requests: ADEQUATE
Special Requests: ADEQUATE

## 2020-06-04 LAB — FIBRIN DERIVATIVES D-DIMER (ARMC ONLY): Fibrin derivatives D-dimer (ARMC): 1531.48 ng/mL (FEU) — ABNORMAL HIGH (ref 0.00–499.00)

## 2020-06-04 LAB — C-REACTIVE PROTEIN: CRP: 2.5 mg/dL — ABNORMAL HIGH (ref <1.0)

## 2020-06-04 LAB — PHOSPHORUS: Phosphorus: 3.5 mg/dL (ref 2.5–4.6)

## 2020-06-04 LAB — FERRITIN: Ferritin: 1896 ng/mL — ABNORMAL HIGH (ref 24–336)

## 2020-06-04 MED ORDER — FUROSEMIDE 10 MG/ML IJ SOLN
40.0000 mg | Freq: Once | INTRAMUSCULAR | Status: AC
  Administered 2020-06-04: 40 mg via INTRAVENOUS
  Filled 2020-06-04: qty 4

## 2020-06-04 MED ORDER — INSULIN GLARGINE 100 UNIT/ML ~~LOC~~ SOLN
25.0000 [IU] | Freq: Two times a day (BID) | SUBCUTANEOUS | Status: DC
  Administered 2020-06-04 – 2020-06-07 (×6): 25 [IU] via SUBCUTANEOUS
  Filled 2020-06-04 (×8): qty 0.25

## 2020-06-04 MED ORDER — ADULT MULTIVITAMIN W/MINERALS CH
1.0000 | ORAL_TABLET | Freq: Every day | ORAL | Status: DC
  Administered 2020-06-05 – 2020-06-07 (×3): 1 via ORAL
  Filled 2020-06-04 (×3): qty 1

## 2020-06-04 MED ORDER — NEPRO/CARBSTEADY PO LIQD
237.0000 mL | Freq: Three times a day (TID) | ORAL | Status: DC
  Administered 2020-06-04 – 2020-06-05 (×3): 237 mL via ORAL

## 2020-06-04 NOTE — Progress Notes (Signed)
PROGRESS NOTE    Steven Luna  DXI:[id number] DOB: November 19, 1970 DOA: 05/30/2020 PCP: Sharlet Salina, MD   Brief Narrative:  Steven Aderhold is a 49 y.o. male with medical history significant of chronic pain on opioids presented to ED with a complaint of shortness of breath, body aches, fever since little over 7 days.  According to patient,  he was tested positive for Covid about a week ago at mobile unit.  Patient continued to have progressive worsening of the symptoms with some nonproductive cough, loss of appetite and some mild intermittent chest pain so he came to ED.  Upon arrival to ED, he was febrile with temperature of 101.9, tachycardia and tachypneic and hypoxic 88% on room air requiring 2 L of oxygen.  CBC and BMP unremarkable.  2 sets of troponin negative.    He was tested positive for COVID-19 again with elevated noncommittal markers. Hospital service were consulted to admit the patient for further management.  He was started on IV Solu-Medrol and remdesivir.  Initially he was requiring 4 L of oxygen but overnight he required almost 10 to 12 L of high flow oxygen.  He was then started on baricitinib next morning.  Assessment & Plan:   Active Problems:   Chronic pain   Chronic low back pain (Location of Primary Source of Pain) (Bilateral) (L>R)   Pneumonia due to COVID-19 virus   Severe sepsis (HCC)   Acute respiratory failure with hypoxia (HCC)   Acute respiratory failure due to COVID-19 (HCC)   Severe sepsis and acute hypoxic respiratory failure secondary to COVID-19 pneumonia: Patient met severe sepsis criteria based on fever, tachycardia tachypnea and hypoxia and lactic acid of 2.4.  This is due to COVID-19 pneumonia.  Patient continues to state that he feels better.  Patient continues to state that he feels better and oxygen demand is lower today and currently he is on about 35 L high flow nasal cannula oxygen with 50% FiO2.  He completed remdesivir yesterday.  We will  continue following: Baricitinib IV Solu-Medrol, antitussive, bronchodilator, zinc, vitamin C.  Patient was encouraged to prone, out of bed to chair, to use incentive spirometry and flutter valve.  Diarrhea: C. difficile ruled out.  Diarrhea improved.  Newly diagnosed uncontrolled type 2 diabetes mellitus with hyperglycemia: Hemoglobin A1c 10.3.  New diagnosis of type 2 diabetes mellitus.  Still hyperglycemic with blood sugar in 300 range.  Will double the dose of Lantus to 25 units twice daily and continue premeal and SSI.  Chronic pain syndrome: Takes Percocet at home.  Continue that.  DVT prophylaxis:    Code Status: Full Code  Family Communication:  None present at bedside.  Plan of care discussed with patient in length and he verbalized understanding and agreed with it.  Called patient's daughter Verdis Frederickson at [phone] day before yesterday.  Patient in constant communication and updating his family himself.   Status is: Inpatient  Remains inpatient appropriate because:Hemodynamically unstable   Dispo: The patient is from: Home              Anticipated d/c is to: Home              Anticipated d/c date is: > 3 days              Patient currently is not medically stable to d/c.        Estimated body mass index is 28.48 kg/m as calculated from the following:   Height as of this encounter: 5'  8" (1.727 m).   Weight as of this encounter: 85 kg.      Nutritional status:               Consultants:   None  Procedures:   None  Antimicrobials:  Anti-infectives (From admission, onward)   Start     Dose/Rate Route Frequency Ordered Stop   05/31/20 1000  remdesivir 100 mg in sodium chloride 0.9 % 100 mL IVPB       "Followed by" Linked Group Details   100 mg 200 mL/hr over 30 Minutes Intravenous Daily 05/30/20 1735 06/03/20 1030   05/31/20 1000  remdesivir 100 mg in sodium chloride 0.9 % 100 mL IVPB  Status:  Discontinued       "Followed by" Linked Group  Details   100 mg 200 mL/hr over 30 Minutes Intravenous Daily 05/30/20 1746 05/30/20 1755   05/30/20 1830  remdesivir 200 mg in sodium chloride 0.9% 250 mL IVPB       "Followed by" Linked Group Details   200 mg 580 mL/hr over 30 Minutes Intravenous Once 05/30/20 1735 05/30/20 2038   05/30/20 1800  remdesivir 200 mg in sodium chloride 0.9% 250 mL IVPB  Status:  Discontinued       "Followed by" Linked Group Details   200 mg 580 mL/hr over 30 Minutes Intravenous Once 05/30/20 1746 05/30/20 1755   05/30/20 1730  cefTRIAXone (ROCEPHIN) 2 g in sodium chloride 0.9 % 100 mL IVPB        2 g 200 mL/hr over 30 Minutes Intravenous  Once 05/30/20 1719 05/30/20 1841   05/30/20 1730  azithromycin (ZITHROMAX) 500 mg in sodium chloride 0.9 % 250 mL IVPB        500 mg 250 mL/hr over 60 Minutes Intravenous  Once 05/30/20 1719 05/30/20 1951         Subjective: Seen and examined.  He tells me that he is feeling much better than how he has felt in last 2 days.  No new complaint.  Objective: Vitals:   06/03/20 2225 06/04/20 0000 06/04/20 0200 06/04/20 0400  BP:  125/81 116/78 124/85  Pulse:  74 72 74  Resp:  19 16 15   Temp:    98.4 F (36.9 C)  TempSrc:    Oral  SpO2: 95% 93% 91% 96%  Weight:      Height:        Intake/Output Summary (Last 24 hours) at 06/04/2020 1226 Last data filed at 06/03/2020 2027 Gross per 24 hour  Intake --  Output 1450 ml  Net -1450 ml   Filed Weights   05/30/20 1214 06/02/20 0420 06/03/20 0534  Weight: 83.9 kg 86.9 kg 85 kg    Examination:  General exam: Appears calm and comfortable  Respiratory system: Diminished breath sounds. Respiratory effort normal. Cardiovascular system: S1 & S2 heard, RRR. No JVD, murmurs, rubs, gallops or clicks. No pedal edema. Gastrointestinal system: Abdomen is nondistended, soft and nontender. No organomegaly or masses felt. Normal bowel sounds heard. Central nervous system: Alert and oriented. No focal neurological  deficits. Extremities: Symmetric 5 x 5 power. Skin: No rashes, lesions or ulcers.  Psychiatry: Judgement and insight appear normal. Mood & affect appropriate.    Data Reviewed: I have personally reviewed following labs and imaging studies  CBC: Recent Labs  Lab 05/30/20 1755 06/01/20 0649 06/02/20 0333 06/03/20 0748 06/04/20 0418  WBC 7.7 6.8 7.1 10.2 10.0  NEUTROABS  --  4.6 4.9 7.3 6.8  HGB 16.8  16.7 16.5 17.1* 17.3*  HCT 47.9 47.1 45.4 45.6 46.6  MCV 95.8 95.3 91.5 89.8 91.6  PLT 222 337 349 286 784   Basic Metabolic Panel: Recent Labs  Lab 05/30/20 1305 05/30/20 1305 05/30/20 1755 06/01/20 0649 06/02/20 0333 06/03/20 0748 06/04/20 0418  NA 133*  --   --  135 133* 133* 134*  K 4.2  --   --  4.8 4.9 4.7 3.9  CL 96*  --   --  102 101 100 100  CO2 23  --   --  14* 17* 18* 23  GLUCOSE 208*  --   --  398* 441* 343* 267*  BUN 11  --   --  24* 32* 34* 29*  CREATININE 0.71   < > 0.75 0.78 0.87 0.61 0.60*  CALCIUM 8.0*  --   --  8.3* 8.3* 8.2* 8.4*  MG  --   --   --  3.0* 2.8* 2.9* 2.7*  PHOS  --   --   --  3.6 4.3 4.3 3.5   < > = values in this interval not displayed.   GFR: Estimated Creatinine Clearance: 119.8 mL/min (A) (by C-G formula based on SCr of 0.6 mg/dL (L)). Liver Function Tests: Recent Labs  Lab 06/01/20 0649 06/02/20 0333 06/03/20 0748 06/04/20 0418  AST 49* 65* 38 41  ALT 44 61* 49* 53*  ALKPHOS 117 117 106 98  BILITOT 1.3* 1.6* 1.2 1.1  PROT 7.4 7.5 7.2 6.8  ALBUMIN 3.2* 3.1* 3.2* 3.1*   No results for input(s): LIPASE, AMYLASE in the last 168 hours. No results for input(s): AMMONIA in the last 168 hours. Coagulation Profile: No results for input(s): INR, PROTIME in the last 168 hours. Cardiac Enzymes: No results for input(s): CKTOTAL, CKMB, CKMBINDEX, TROPONINI in the last 168 hours. BNP (last 3 results) No results for input(s): PROBNP in the last 8760 hours. HbA1C: Recent Labs    06/02/20 0333  HGBA1C 10.7*   CBG: Recent Labs   Lab 06/03/20 0847 06/03/20 1217 06/03/20 1722 06/03/20 2038 06/04/20 0913  GLUCAP 310* 420* 371* 395* 304*   Lipid Profile: No results for input(s): CHOL, HDL, LDLCALC, TRIG, CHOLHDL, LDLDIRECT in the last 72 hours. Thyroid Function Tests: No results for input(s): TSH, T4TOTAL, FREET4, T3FREE, THYROIDAB in the last 72 hours. Anemia Panel: Recent Labs    06/03/20 0748 06/04/20 0418  FERRITIN 1,838* 1,896*   Sepsis Labs: Recent Labs  Lab 05/30/20 1755  PROCALCITON 0.30  LATICACIDVEN 2.4*    Recent Results (from the past 240 hour(s))  Culture, blood (Routine X 2) w Reflex to ID Panel     Status: None   Collection Time: 05/30/20  5:55 PM   Specimen: BLOOD  Result Value Ref Range Status   Specimen Description BLOOD LEFT ANTECUBITAL  Final   Special Requests   Final    BOTTLES DRAWN AEROBIC AND ANAEROBIC Blood Culture adequate volume   Culture   Final    NO GROWTH 5 DAYS Performed at [facility], [address], [place], [place] [address]    Report Status 06/04/2020 FINAL  Final  Respiratory Panel by RT PCR (Flu A&B, Covid) - Nasopharyngeal Swab     Status: Abnormal   Collection Time: 05/30/20  5:55 PM   Specimen: Nasopharyngeal Swab  Result Value Ref Range Status   SARS Coronavirus 2 by RT PCR POSITIVE (A) NEGATIVE Final    Comment: RESULT CALLED TO, READ BACK BY AND VERIFIED WITH: [facility]  [facility] 05/30/20 AT 1922 BY ACR (NOTE) SARS-CoV-2 target nucleic acids are DETECTED.  SARS-CoV-2 RNA is generally detectable in upper respiratory specimens  during the acute phase of infection. Positive results are indicative of the presence of the identified virus, but do not rule out bacterial infection or co-infection with other pathogens not detected by the test. Clinical correlation with patient history and other diagnostic information is necessary to determine patient infection status. The expected result is Negative.  Fact Sheet for Patients:   PinkCheek.be  Fact Sheet for Healthcare Providers: GravelBags.it  This test is not yet approved or cleared by the [place] FDA and  has been authorized for detection and/or diagnosis of SARS-CoV-2 by FDA under an Emergency Use Authorization (EUA).  This EUA will remain in effect (meaning this test can  be used) for the duration of  the COVID-19 declaration under Section 564(b)(1) of the Act, 21 U.S.C. section 360bbb-3(b)(1), unless the authorization is terminated or revoked sooner.      Influenza A by PCR NEGATIVE NEGATIVE Final   Influenza B by PCR NEGATIVE NEGATIVE Final    Comment: (NOTE) The Xpert Xpress SARS-CoV-2/FLU/RSV assay is intended as an aid in  the diagnosis of influenza from Nasopharyngeal swab specimens and  should not be used as a sole basis for treatment. Nasal washings and  aspirates are unacceptable for Xpert Xpress SARS-CoV-2/FLU/RSV  testing.  Fact Sheet for Patients: PinkCheek.be  Fact Sheet for Healthcare Providers: GravelBags.it  This test is not yet approved or cleared by the [place] FDA and  has been authorized for detection and/or diagnosis of SARS-CoV-2 by  FDA under an Emergency Use Authorization (EUA). This EUA will remain  in effect (meaning this test can be used) for the duration of the  Covid-19 declaration under Section 564(b)(1) of the Act, 21  U.S.C. section 360bbb-3(b)(1), unless the authorization is  terminated or revoked. Performed at [facility], [address] [place], [place] [address]   Culture, blood (routine x 2)     Status: None   Collection Time: 05/30/20  6:20 PM   Specimen: BLOOD  Result Value Ref Range Status   Specimen Description BLOOD RIGTH [facility]  Final   Special Requests   Final    BOTTLES DRAWN AEROBIC AND ANAEROBIC Blood Culture adequate volume   Culture   Final    NO GROWTH 5  DAYS Performed at [facility], [address], [place], [place] [address]    Report Status 06/04/2020 FINAL  Final  C Difficile Quick Screen w PCR reflex     Status: None   Collection Time: 06/02/20 10:30 AM   Specimen: STOOL  Result Value Ref Range Status   C Diff antigen NEGATIVE NEGATIVE Final   C Diff toxin NEGATIVE NEGATIVE Final   C Diff interpretation No C. difficile detected.  Final    Comment: Performed at [facility], [address], [place], [place] [address]      Radiology Studies: No results found.  Scheduled Meds: . vitamin C  500 mg Oral Daily  . baricitinib  4 mg Oral Daily  . enoxaparin (LOVENOX) injection  40 mg Subcutaneous Q24H  . insulin aspart  0-15 Units Subcutaneous TID WC  . insulin aspart  0-5 Units Subcutaneous QHS  . insulin aspart  3 Units Subcutaneous TID WC  . insulin glargine  25 Units Subcutaneous Daily  . Ipratropium-Albuterol  1 puff Inhalation Q6H  . predniSONE  50 mg Oral Daily  .  sodium chloride flush  3 mL Intravenous Q12H  . zinc sulfate  220 mg Oral Daily   Continuous Infusions:    LOS: 5 days   Time spent: 31 minutes   Darliss Cheney, MD [facility]  06/04/2020, 12:26 PM   To contact the attending provider between 7A-7P or the covering provider during after hours 7P-7A, please log into the web site [email or web address].

## 2020-06-04 NOTE — Progress Notes (Signed)
Inpatient Diabetes Program Recommendations  AACE/ADA: New Consensus Statement on Inpatient Glycemic Control   Target Ranges:  Prepandial:   less than 140 mg/dL      Peak postprandial:   less than 180 mg/dL (1-2 hours)      Critically ill patients:  140 - 180 mg/dL  Results for Okun, [place] (MRN [id number]) as of 06/04/2020 08:11  Ref. Range 06/04/2020 04:18  Glucose Latest Ref Range: 70 - 99 mg/dL 224 (H)   Results for Vasek, [place] (MRN [id number]) as of 06/04/2020 08:11  Ref. Range 06/03/2020 08:47 06/03/2020 12:17 06/03/2020 17:22 06/03/2020 20:38  Glucose-Capillary Latest Ref Range: 70 - 99 mg/dL 888 (H) 916 (H) 945 (H) 395 (H)   Review of Glycemic Control  Diabetes history: No Outpatient Diabetes medications: NA Current orders for Inpatient glycemic control: Lantus 25 units daily, Novolog 3 units TID with meals, Novolog 0-15 units TID with meals, Novolog 0-5 units QHS; Prednisone 50 mg daily  Inpatient Diabetes Program Recommendations:    Insulin: If steroids are continued as ordered please consider increasing Lantus to 30 units daily and meal coverage to Novolog 7 units TID with meals.  Thanks, Orlando Penner, RN, MSN, CDE Diabetes Coordinator Inpatient Diabetes Program [phone] (Team Pager from 8am to 5pm)

## 2020-06-04 NOTE — Progress Notes (Signed)
Initial Nutrition Assessment  DOCUMENTATION CODES:   Not applicable  INTERVENTION:   Nepro Shake po TID, each supplement provides 425 kcal and 19 grams protein  MVI daily   Carbohydrate controlled diet   NUTRITION DIAGNOSIS:   Increased nutrient needs related to catabolic illness (COVID 19) as evidenced by increased estimated needs.  GOAL:   Patient will meet greater than or equal to 90% of their needs  MONITOR:   PO intake, Supplement acceptance, Labs, Weight trends, Skin, I & O's  REASON FOR ASSESSMENT:   Consult Diet education  ASSESSMENT:   49 y.o. male with medical history significant of chronic pain on opioids who is admitted with sepsis secondary to COVID 19 PNA and new DM   RD received consult for diabetes diet education. Per chart review, pt with poor appetite and oral intake for 2 weeks pta r/t COVID 19. Pt has continued to have poor appetite and oral intake in hospital; pt eating only sips and bites of meals. RD will add supplements and MVI to help pt meet his estimated needs. RD will also change pt to a carbohydrate controlled diet. There is no weight history in chart to determine if any recent significant weight changes. Pt is not appropriate for diet education at this time. RD will attempt to provide diabetes diet education prior to discharge and once medically appropriate.  Medications reviewed and include: vitamin C, lovenox, lasix, insulin, zinc, prednisone  Labs reviewed: Na 134(L), BUN 29(H), creat 0.60(L), P 3.5 wnl, Mg 2.7(H) cbgs- 310, 420, 371, 395, 304 x 24 hrs AIC 10.7(H)- 9/19  NUTRITION - FOCUSED PHYSICAL EXAM: Unable to perform at this time as pt with COVID 19  Diet Order:   Diet Order            Diet Carb Modified Fluid consistency: Thin; Room service appropriate? Yes  Diet effective now                EDUCATION NEEDS:   Not appropriate for education at this time  Skin:  Skin Assessment: Reviewed RN Assessment  Last BM:  9/19-  type 7  Height:   Ht Readings from Last 1 Encounters:  05/30/20 5\' 8"  (1.727 m)    Weight:   Wt Readings from Last 1 Encounters:  06/03/20 85 kg    Ideal Body Weight:  70 kg  BMI:  Body mass index is 28.48 kg/m.  Estimated Nutritional Needs:   Kcal:  2300-2600kcal/day  Protein:  115-130g/day  Fluid:  >2.1L/day  06/05/20 MS, RD, LDN Please refer to [facility] for RD and/or RD on-call/weekend/after hours pager

## 2020-06-05 DIAGNOSIS — A419 Sepsis, unspecified organism: Secondary | ICD-10-CM | POA: Diagnosis not present

## 2020-06-05 DIAGNOSIS — R6520 Severe sepsis without septic shock: Secondary | ICD-10-CM | POA: Diagnosis not present

## 2020-06-05 DIAGNOSIS — J9600 Acute respiratory failure, unspecified whether with hypoxia or hypercapnia: Secondary | ICD-10-CM | POA: Diagnosis not present

## 2020-06-05 DIAGNOSIS — U071 COVID-19: Secondary | ICD-10-CM | POA: Diagnosis not present

## 2020-06-05 LAB — GLUCOSE, CAPILLARY
Glucose-Capillary: 201 mg/dL — ABNORMAL HIGH (ref 70–99)
Glucose-Capillary: 286 mg/dL — ABNORMAL HIGH (ref 70–99)
Glucose-Capillary: 399 mg/dL — ABNORMAL HIGH (ref 70–99)
Glucose-Capillary: 317 mg/dL — ABNORMAL HIGH (ref 70–99)

## 2020-06-05 NOTE — Plan of Care (Signed)
Pt resting in bed comfortably He appears to be in no apparent distress Pt educated on proning and the benefit Pt able to self prone. No distress noted.    VSS. Pt able to be weaned to 10L HFNC Tolerated all meals. Educated to avoid eating snacks at the bedside to help control blood glucose.  Updated on plan of care.  Time allowed for questions and concerns Denies any additional wants or needs Denies any pain. Verbalizes an understanding on plan.  Call bell within reach.  Will continue to closely monitor.    Problem: Education: Goal: Knowledge of General Education information will improve Description: Including pain rating scale, medication(s)/side effects and non-pharmacologic comfort measures Outcome: Progressing   Problem: Health Behavior/Discharge Planning: Goal: Ability to manage health-related needs will improve Outcome: Progressing   Problem: Clinical Measurements: Goal: Ability to maintain clinical measurements within normal limits will improve Outcome: Progressing Goal: Will remain free from infection Outcome: Progressing Goal: Diagnostic test results will improve Outcome: Progressing Goal: Respiratory complications will improve Outcome: Progressing Goal: Cardiovascular complication will be avoided Outcome: Progressing   Problem: Activity: Goal: Risk for activity intolerance will decrease Outcome: Progressing   Problem: Nutrition: Goal: Adequate nutrition will be maintained Outcome: Progressing   Problem: Coping: Goal: Level of anxiety will decrease Outcome: Progressing   Problem: Elimination: Goal: Will not experience complications related to bowel motility Outcome: Progressing Goal: Will not experience complications related to urinary retention Outcome: Progressing   Problem: Pain Managment: Goal: General experience of comfort will improve Outcome: Progressing   Problem: Safety: Goal: Ability to remain free from injury will improve Outcome:  Progressing   Problem: Skin Integrity: Goal: Risk for impaired skin integrity will decrease Outcome: Progressing   Problem: Education: Goal: Knowledge of risk factors and measures for prevention of condition will improve Outcome: Progressing   Problem: Coping: Goal: Psychosocial and spiritual needs will be supported Outcome: Progressing   Problem: Respiratory: Goal: Will maintain a patent airway Outcome: Progressing Goal: Complications related to the disease process, condition or treatment will be avoided or minimized Outcome: Progressing

## 2020-06-05 NOTE — Progress Notes (Signed)
PROGRESS NOTE    Steven Luna  GQB:[id number] DOB: 23-Sep-1970 DOA: 05/30/2020 PCP: Sharlet Salina, MD   Brief Narrative: Steven Luna a 49 y.o.malewith medical history significant ofchronic pain on opioids presented to ED with a complaint of shortness of breath, body aches, fever since little over 7 days. According to patient,  he was tested positive for Covid about a week ago at mobile unit. Patient continued to have progressive worsening of the symptoms with some nonproductive cough, loss of appetite and some mild intermittent chest pain so he came to ED.  Upon arrival to ED, he was febrile with temperature of 101.9, tachycardia and tachypneic and hypoxic 88% on room air requiring 2 L of oxygen. CBC and BMP unremarkable. 2 sets of troponin negative.   He was tested positive for COVID-19 again with elevated noncommittal markers.Hospital service were consulted to admit the patient for further management.  He was started on IV Solu-Medrol and remdesivir.  Initially he was requiring 4 L of oxygen but overnight he required almost 10 to 12 L of high flow oxygen. He was then started on baricitinib next morning.   Assessment & Plan:   Active Problems:   Chronic pain   Chronic low back pain (Location of Primary Source of Pain) (Bilateral) (L>R)   Pneumonia due to COVID-19 virus   Severe sepsis (HCC)   Acute respiratory failure with hypoxia (HCC)   Acute respiratory failure due to COVID-19 (HCC)  Severe sepsis and acute hypoxic respiratory failure secondary to COVID-19 pneumonia: Patient met severe sepsis criteria based on fever, tachycardia tachypnea and hypoxia and lactic acid of 2.4.  This is due to COVID-19 pneumonia.  Patient continues to state that he feels better.  Patient continues to state that he feels better and oxygen demand is lower today and currently he is on about 35 L high flow nasal cannula oxygen with 50% FiO2.  He completed remdesivir yesterday.  We will continue  following: Baricitinib IV Solu-Medrol, antitussive, bronchodilator, zinc, vitamin C.  Patient was encouraged to prone, out of bed to chair, to use incentive spirometry and flutter valve. 9/22 Sepsis is resolved. Continue with anti-covid regimen treatment.  D/w pt about proning.  COVID-19 Labs  Recent Labs    06/03/20 0748 06/04/20 0418  FERRITIN 1,838* 1,896*  CRP 4.0* 2.5*    Lab Results  Component Value Date   SARSCOV2NAA POSITIVE (A) 05/30/2020  SpO2: 95 % O2 Flow Rate (L/min): 35 L/min FiO2 (%): 45 %     Diarrhea: C. difficile ruled out.  Diarrhea improved. - attribute to Covid-19 enteritis whish has resolved.    Newly diagnosed uncontrolled type 2 diabetes mellitus with hyperglycemia:  Hemoglobin A1c 10.3.  New diagnosis of type 2 diabetes mellitus.  Still hyperglycemic with blood sugar in 300 range.  Will double the dose of Lantus to 25 units twice daily and continue premeal and SSI.  Chronic pain syndrome: Takes Percocet at home.  Continue that. No change in t/t plan.  DVT prophylaxis:  Lovenox 40 mg subcutaneously.  Code Status: Full Family Communication: daughter Verdis Frederickson at [phone]  Disposition Plan: Home   Status is: Inpatient Remains inpatient appropriate because:Inpatient level of care appropriate due to severity of illness Dispo: The patient is from: Home              Anticipated d/c is to: Home              Anticipated d/c date is: > 3 days  Patient currently is not medically stable to d/c.  Consultants:  None.  Antimicrobials Anti-infectives (From admission, onward)   Start     Dose/Rate Route Frequency Ordered Stop   05/31/20 1000  remdesivir 100 mg in sodium chloride 0.9 % 100 mL IVPB       "Followed by" Linked Group Details   100 mg 200 mL/hr over 30 Minutes Intravenous Daily 05/30/20 1735 06/03/20 1030   05/31/20 1000  remdesivir 100 mg in sodium chloride 0.9 % 100 mL IVPB  Status:  Discontinued       "Followed by"  Linked Group Details   100 mg 200 mL/hr over 30 Minutes Intravenous Daily 05/30/20 1746 05/30/20 1755   05/30/20 1830  remdesivir 200 mg in sodium chloride 0.9% 250 mL IVPB       "Followed by" Linked Group Details   200 mg 580 mL/hr over 30 Minutes Intravenous Once 05/30/20 1735 05/30/20 2038   05/30/20 1800  remdesivir 200 mg in sodium chloride 0.9% 250 mL IVPB  Status:  Discontinued       "Followed by" Linked Group Details   200 mg 580 mL/hr over 30 Minutes Intravenous Once 05/30/20 1746 05/30/20 1755   05/30/20 1730  cefTRIAXone (ROCEPHIN) 2 g in sodium chloride 0.9 % 100 mL IVPB        2 g 200 mL/hr over 30 Minutes Intravenous  Once 05/30/20 1719 05/30/20 1841   05/30/20 1730  azithromycin (ZITHROMAX) 500 mg in sodium chloride 0.9 % 250 mL IVPB        500 mg 250 mL/hr over 60 Minutes Intravenous  Once 05/30/20 1719 05/30/20 1951      Subjective: Pt is resting and alert,awake and oriented and denies any complaints today.  D/w him about proning all throughout the day.   Objective: Vitals:   06/05/20 0400 06/05/20 0500 06/05/20 0824 06/05/20 1211  BP: 113/87  (!) 113/91 123/77  Pulse: 80  96 96  Resp: 16  16 15   Temp:  98.2 F (36.8 C) 97.8 F (36.6 C) 97.6 F (36.4 C)  TempSrc:  Oral Oral Oral  SpO2: 93%  93% 95%  Weight:  84.6 kg    Height:        Intake/Output Summary (Last 24 hours) at 06/05/2020 1603 Last data filed at 06/05/2020 1100 Gross per 24 hour  Intake --  Output 1250 ml  Net -1250 ml   Filed Weights   06/02/20 0420 06/03/20 0534 06/05/20 0500  Weight: 86.9 kg 85 kg 84.6 kg    Examination: Blood pressure 123/77, pulse 96, temperature 97.6 F (36.4 C), temperature source Oral, resp. rate 15, height 5' 8"  (1.727 m), weight 84.6 kg, SpO2 95 %. General exam: Appears calm and comfortable  Respiratory system: Clear to auscultation. Respiratory effort normal. Cardiovascular system: S1 & S2 heard, RRR. No JVD, murmurs, rubs, gallops or clicks. No pedal  edema. Gastrointestinal system: Abdomen is nondistended, soft and nontender. No organomegaly or masses felt. Normal bowel sounds heard. Central nervous system: Alert and oriented. No focal neurological deficits. Extremities: Symmetric 5 x 5 power. Skin: No rashes, lesions or ulcers Psychiatry: Judgement and insight appear normal. Mood & affect appropriate.   Data Reviewed: I have personally reviewed following labs and imaging studies  I/O last 3 completed shifts: In: -  Out: 5400 [Urine:3450] Total I/O In: -  Out: 450 [Urine:450] Lab Results  Component Value Date   CREATININE 0.60 (L) 06/04/2020   CREATININE 0.61 06/03/2020   CREATININE 0.87  06/02/2020   CBC: Recent Labs  Lab 05/30/20 1755 06/01/20 0649 06/02/20 0333 06/03/20 0748 06/04/20 0418  WBC 7.7 6.8 7.1 10.2 10.0  NEUTROABS  --  4.6 4.9 7.3 6.8  HGB 16.8 16.7 16.5 17.1* 17.3*  HCT 47.9 47.1 45.4 45.6 46.6  MCV 95.8 95.3 91.5 89.8 91.6  PLT 222 337 349 286 482   Basic Metabolic Panel: Recent Labs  Lab 05/30/20 1305 05/30/20 1305 05/30/20 1755 06/01/20 0649 06/02/20 0333 06/03/20 0748 06/04/20 0418  NA 133*  --   --  135 133* 133* 134*  K 4.2  --   --  4.8 4.9 4.7 3.9  CL 96*  --   --  102 101 100 100  CO2 23  --   --  14* 17* 18* 23  GLUCOSE 208*  --   --  398* 441* 343* 267*  BUN 11  --   --  24* 32* 34* 29*  CREATININE 0.71   < > 0.75 0.78 0.87 0.61 0.60*  CALCIUM 8.0*  --   --  8.3* 8.3* 8.2* 8.4*  MG  --   --   --  3.0* 2.8* 2.9* 2.7*  PHOS  --   --   --  3.6 4.3 4.3 3.5   < > = values in this interval not displayed.   GFR: Estimated Creatinine Clearance: 119.6 mL/min (A) (by C-G formula based on SCr of 0.6 mg/dL (L)). Liver Function Tests: Recent Labs  Lab 06/01/20 0649 06/02/20 0333 06/03/20 0748 06/04/20 0418  AST 49* 65* 38 41  ALT 44 61* 49* 53*  ALKPHOS 117 117 106 98  BILITOT 1.3* 1.6* 1.2 1.1  PROT 7.4 7.5 7.2 6.8  ALBUMIN 3.2* 3.1* 3.2* 3.1*   CBG: Recent Labs  Lab  06/04/20 1234 06/04/20 1726 06/04/20 2221 06/05/20 0822 06/05/20 1209  GLUCAP 371* 370* 339* 201* 286*   Anemia Panel: Recent Labs    06/03/20 0748 06/04/20 0418  FERRITIN 1,838* 1,896*   Sepsis Labs: Recent Labs  Lab 05/30/20 1755  PROCALCITON 0.30  LATICACIDVEN 2.4*    Recent Results (from the past 240 hour(s))  Culture, blood (Routine X 2) w Reflex to ID Panel     Status: None   Collection Time: 05/30/20  5:55 PM   Specimen: BLOOD  Result Value Ref Range Status   Specimen Description BLOOD LEFT ANTECUBITAL  Final   Special Requests   Final    BOTTLES DRAWN AEROBIC AND ANAEROBIC Blood Culture adequate volume   Culture   Final    NO GROWTH 5 DAYS Performed at [facility], [address], [place], [place] [address]    Report Status 06/04/2020 FINAL  Final  Respiratory Panel by RT PCR (Flu A&B, Covid) - Nasopharyngeal Swab     Status: Abnormal   Collection Time: 05/30/20  5:55 PM   Specimen: Nasopharyngeal Swab  Result Value Ref Range Status   SARS Coronavirus 2 by RT PCR POSITIVE (A) NEGATIVE Final    Comment: RESULT CALLED TO, READ BACK BY AND VERIFIED WITH: [facility] 05/30/20 AT 1922 BY ACR (NOTE) SARS-CoV-2 target nucleic acids are DETECTED.  SARS-CoV-2 RNA is generally detectable in upper respiratory specimens  during the acute phase of infection. Positive results are indicative of the presence of the identified virus, but do not rule out bacterial infection or co-infection with other pathogens not detected by the test. Clinical correlation with patient history and other diagnostic information is necessary to determine patient infection  status. The expected result is Negative.  Fact Sheet for Patients:  PinkCheek.be  Fact Sheet for Healthcare Providers: GravelBags.it  This test is not yet approved or cleared by the [place] FDA and  has been authorized for detection and/or  diagnosis of SARS-CoV-2 by FDA under an Emergency Use Authorization (EUA).  This EUA will remain in effect (meaning this test can  be used) for the duration of  the COVID-19 declaration under Section 564(b)(1) of the Act, 21 U.S.C. section 360bbb-3(b)(1), unless the authorization is terminated or revoked sooner.      Influenza A by PCR NEGATIVE NEGATIVE Final   Influenza B by PCR NEGATIVE NEGATIVE Final    Comment: (NOTE) The Xpert Xpress SARS-CoV-2/FLU/RSV assay is intended as an aid in  the diagnosis of influenza from Nasopharyngeal swab specimens and  should not be used as a sole basis for treatment. Nasal washings and  aspirates are unacceptable for Xpert Xpress SARS-CoV-2/FLU/RSV  testing.  Fact Sheet for Patients: PinkCheek.be  Fact Sheet for Healthcare Providers: GravelBags.it  This test is not yet approved or cleared by the [place] FDA and  has been authorized for detection and/or diagnosis of SARS-CoV-2 by  FDA under an Emergency Use Authorization (EUA). This EUA will remain  in effect (meaning this test can be used) for the duration of the  Covid-19 declaration under Section 564(b)(1) of the Act, 21  U.S.C. section 360bbb-3(b)(1), unless the authorization is  terminated or revoked. Performed at [facility], [address] [place], [place] [address]   Culture, blood (routine x 2)     Status: None   Collection Time: 05/30/20  6:20 PM   Specimen: BLOOD  Result Value Ref Range Status   Specimen Description BLOOD RIGTH [facility]  Final   Special Requests   Final    BOTTLES DRAWN AEROBIC AND ANAEROBIC Blood Culture adequate volume   Culture   Final    NO GROWTH 5 DAYS Performed at [facility], [address], [place], [place] [address]    Report Status 06/04/2020 FINAL  Final  C Difficile Quick Screen w PCR reflex     Status: None   Collection Time: 06/02/20 10:30 AM   Specimen: STOOL    Result Value Ref Range Status   C Diff antigen NEGATIVE NEGATIVE Final   C Diff toxin NEGATIVE NEGATIVE Final   C Diff interpretation No C. difficile detected.  Final    Comment: Performed at [facility], [address] [place], [place] [address]     Scheduled Meds: . vitamin C  500 mg Oral Daily  . baricitinib  4 mg Oral Daily  . enoxaparin (LOVENOX) injection  40 mg Subcutaneous Q24H  . feeding supplement (NEPRO CARB STEADY)  237 mL Oral TID WC  . insulin aspart  0-15 Units Subcutaneous TID WC  . insulin aspart  0-5 Units Subcutaneous QHS  . insulin aspart  3 Units Subcutaneous TID WC  . insulin glargine  25 Units Subcutaneous BID  . Ipratropium-Albuterol  1 puff Inhalation Q6H  . multivitamin with minerals  1 tablet Oral Daily  . predniSONE  50 mg Oral Daily  . sodium chloride flush  3 mL Intravenous Q12H  . zinc sulfate  220 mg Oral Daily    LOS: 6 days   Para Skeans, MD [facility] . Pager [phone] If 7PM-7AM, please contact night-coverage [email or web address] Password [facility] 06/05/2020, 4:03 PM

## 2020-06-06 DIAGNOSIS — Z794 Long term (current) use of insulin: Secondary | ICD-10-CM | POA: Diagnosis not present

## 2020-06-06 DIAGNOSIS — U071 COVID-19: Secondary | ICD-10-CM | POA: Diagnosis not present

## 2020-06-06 DIAGNOSIS — J9600 Acute respiratory failure, unspecified whether with hypoxia or hypercapnia: Secondary | ICD-10-CM | POA: Diagnosis not present

## 2020-06-06 DIAGNOSIS — E119 Type 2 diabetes mellitus without complications: Secondary | ICD-10-CM | POA: Diagnosis not present

## 2020-06-06 LAB — COMPREHENSIVE METABOLIC PANEL WITH GFR
ALT: 65 U/L — ABNORMAL HIGH (ref 0–44)
AST: 74 U/L — ABNORMAL HIGH (ref 15–41)
Albumin: 3 g/dL — ABNORMAL LOW (ref 3.5–5.0)
Alkaline Phosphatase: 79 U/L (ref 38–126)
Anion gap: 9 (ref 5–15)
BUN: 17 mg/dL (ref 6–20)
CO2: 28 mmol/L (ref 22–32)
Calcium: 8.4 mg/dL — ABNORMAL LOW (ref 8.9–10.3)
Chloride: 95 mmol/L — ABNORMAL LOW (ref 98–111)
Creatinine, Ser: 0.82 mg/dL (ref 0.61–1.24)
GFR calc Af Amer: >60 mL/min
GFR calc non Af Amer: >60 mL/min
Glucose, Bld: 275 mg/dL — ABNORMAL HIGH (ref 70–99)
Potassium: 3.5 mmol/L (ref 3.5–5.1)
Sodium: 132 mmol/L — ABNORMAL LOW (ref 135–145)
Total Bilirubin: 0.8 mg/dL (ref 0.3–1.2)
Total Protein: 6.5 g/dL (ref 6.5–8.1)

## 2020-06-06 LAB — GLUCOSE, CAPILLARY
Glucose-Capillary: 122 mg/dL — ABNORMAL HIGH (ref 70–99)
Glucose-Capillary: 243 mg/dL — ABNORMAL HIGH (ref 70–99)
Glucose-Capillary: 295 mg/dL — ABNORMAL HIGH (ref 70–99)
Glucose-Capillary: 323 mg/dL — ABNORMAL HIGH (ref 70–99)

## 2020-06-06 LAB — LACTATE DEHYDROGENASE: LDH: 222 U/L — ABNORMAL HIGH (ref 98–192)

## 2020-06-06 LAB — PHOSPHORUS: Phosphorus: 4.3 mg/dL (ref 2.5–4.6)

## 2020-06-06 LAB — MAGNESIUM: Magnesium: 2.3 mg/dL (ref 1.7–2.4)

## 2020-06-06 LAB — C-REACTIVE PROTEIN: CRP: 1.5 mg/dL — ABNORMAL HIGH (ref <1.0)

## 2020-06-06 LAB — FERRITIN: Ferritin: 2060 ng/mL — ABNORMAL HIGH (ref 24–336)

## 2020-06-06 MED ORDER — INSULIN STARTER KIT- SYRINGES (SPANISH)
1.0000 | Freq: Once | Status: DC
  Filled 2020-06-06: qty 1

## 2020-06-06 NOTE — Progress Notes (Signed)
PROGRESS NOTE    Steven Luna  QMG:[id number] DOB: [birth year] DOA: 05/30/2020 PCP: Sharlet Salina, MD   Brief Narrative: Steven Alvarezis a 49 y.o.malewith medical history significant ofchronic pain on opioids presented to ED with a complaint of shortness of breath, body aches, fever since little over 7 days. According to patient,  he was tested positive for Covid about a week ago at mobile unit. Patient continued to have progressive worsening of the symptoms with some nonproductive cough, loss of appetite and some mild intermittent chest pain so he came to ED.  Upon arrival to ED, he was febrile with temperature of 101.9, tachycardia and tachypneic and hypoxic 88% on room air requiring 2 L of oxygen. CBC and BMP unremarkable. 2 sets of troponin negative.   He was tested positive for COVID-19 again with elevated noncommittal markers.Hospital service were consulted to admit the patient for further management.  He was started on IV Solu-Medrol and remdesivir.  Initially he was requiring 4 L of oxygen but overnight he required almost 10 to 12 L of high flow oxygen. He was then started on baricitinib next morning.   Assessment & Plan:   Active Problems:   Chronic pain   Chronic low back pain (Location of Primary Source of Pain) (Bilateral) (L>R)   Pneumonia due to COVID-19 virus   Severe sepsis (HCC)   Acute respiratory failure with hypoxia (HCC)   Acute respiratory failure due to COVID-19 (HCC)  Severe sepsis and acute hypoxic respiratory failure secondary to COVID-19 pneumonia: Patient met severe sepsis criteria based on fever, tachycardia tachypnea and hypoxia and lactic acid of 2.4.  This is due to COVID-19 pneumonia.  Patient continues to state that he feels better.  Patient continues to state that he feels better and oxygen demand is lower today and currently he is on about 35 L high flow nasal cannula oxygen with 50% FiO2.  He completed remdesivir yesterday.  We will continue  following: Baricitinib IV Solu-Medrol, antitussive, bronchodilator, zinc, vitamin C.  Patient was encouraged to prone, out of bed to chair, to use incentive spirometry and flutter valve. 9/22 Sepsis is resolved. Continue with anti-covid regimen treatment.  D/w pt about proning.  COVID-19 Labs  Recent Labs    06/04/20 0418 06/06/20 0959  FERRITIN 1,896* 2,060*  LDH  --  222*  CRP 2.5* 1.5*    Lab Results  Component Value Date   SARSCOV2NAA POSITIVE (A) 05/30/2020  SpO2: 96 % O2 Flow Rate (L/min): 7 L/min FiO2 (%): 45 %  9/23 COVID-19 Labs  Recent Labs    06/04/20 0418 06/06/20 0959  FERRITIN 1,896* 2,060*  LDH  --  222*  CRP 2.5* 1.5*    Lab Results  Component Value Date   SARSCOV2NAA POSITIVE (A) 05/30/2020  SpO2: 96 % O2 Flow Rate (L/min): 7 L/min FiO2 (%): 45 %  Diarrhea: C. difficile ruled out.  Diarrhea improved. - attribute to Covid-19 enteritis whish has resolved.    Newly diagnosed uncontrolled type 2 diabetes mellitus with hyperglycemia:  Hemoglobin A1c 10.3.  New diagnosis of type 2 diabetes mellitus.  Still hyperglycemic with blood sugar in 300 range.  Will double the dose of Lantus to 25 units twice daily and continue premeal and SSI.  Chronic pain syndrome: Takes Percocet at home.  Continue that. No change in t/t plan.  DVT prophylaxis:  Lovenox 40 mg subcutaneously.  Code Status: Full Family Communication: daughter Verdis Frederickson at [phone]  Disposition Plan: Home   Status is: Inpatient Remains inpatient  appropriate because:Inpatient level of care appropriate due to severity of illness Dispo: The patient is from: Home              Anticipated d/c is to: Home              Anticipated d/c date is: > 3 days              Patient currently is not medically stable to d/c.  Consultants:  None.  Antimicrobials Anti-infectives (From admission, onward)   Start     Dose/Rate Route Frequency Ordered Stop   05/31/20 1000  remdesivir 100 mg in  sodium chloride 0.9 % 100 mL IVPB       "Followed by" Linked Group Details   100 mg 200 mL/hr over 30 Minutes Intravenous Daily 05/30/20 1735 06/03/20 1030   05/31/20 1000  remdesivir 100 mg in sodium chloride 0.9 % 100 mL IVPB  Status:  Discontinued       "Followed by" Linked Group Details   100 mg 200 mL/hr over 30 Minutes Intravenous Daily 05/30/20 1746 05/30/20 1755   05/30/20 1830  remdesivir 200 mg in sodium chloride 0.9% 250 mL IVPB       "Followed by" Linked Group Details   200 mg 580 mL/hr over 30 Minutes Intravenous Once 05/30/20 1735 05/30/20 2038   05/30/20 1800  remdesivir 200 mg in sodium chloride 0.9% 250 mL IVPB  Status:  Discontinued       "Followed by" Linked Group Details   200 mg 580 mL/hr over 30 Minutes Intravenous Once 05/30/20 1746 05/30/20 1755   05/30/20 1730  cefTRIAXone (ROCEPHIN) 2 g in sodium chloride 0.9 % 100 mL IVPB        2 g 200 mL/hr over 30 Minutes Intravenous  Once 05/30/20 1719 05/30/20 1841   05/30/20 1730  azithromycin (ZITHROMAX) 500 mg in sodium chloride 0.9 % 250 mL IVPB        500 mg 250 mL/hr over 60 Minutes Intravenous  Once 05/30/20 1719 05/30/20 1951      Subjective: Pt is resting and alert,awake and oriented and denies any complaints today.  D/w him about proning all throughout the day.   9/23 Pt is alert awake much more talkative and smiling expect he may be abel to go home tomorrow. He is at 6 L now.   Objective: Vitals:   06/06/20 0300 06/06/20 0500 06/06/20 0811 06/06/20 1201  BP: 118/86 104/84 120/88 117/85  Pulse:   97 98  Resp: (!) _0 Temp: 98.9 F (37.2 C)  97.8 F (36.6 C) 98.9 F (37.2 C)  TempSrc: Axillary  Oral Oral  SpO2: 98%  97% 96%  Weight: 83.3 kg     Height:        Intake/Output Summary (Last 24 hours) at 06/06/2020 1553 Last data filed at 06/06/2020 1434 Gross per 24 hour  Intake --  Output 2640 ml  Net -2640 ml   Filed Weights   06/03/20 0534 06/05/20 0500 06/06/20 0300  Weight: 85  kg 84.6 kg 83.3 kg    Examination: Blood pressure 117/85, pulse 98, temperature 98.9 F (37.2 C), temperature source Oral, resp. rate 19, height _1  (1.727 m), weight 83.3 kg, SpO2 96 %. General exam: Appears calm and comfortable  Respiratory system: Clear to auscultation. Respiratory effort normal. Cardiovascular system: RRR. No JVD, murmurs, rubs, gallops or clicks. No pedal edema. Gastrointestinal system: Abdomen is nondistended, soft and nontender. No organomegaly or masses  felt. Normal bowel sounds heard. Central nervous system: Alert and oriented. No focal neurological deficits. Extremities: Symmetric 5 x 5 power. Skin: No rashes, lesions or ulcers Psychiatry: Judgement and insight appear normal. Mood & affect appropriate.   Data Reviewed: I have personally reviewed following labs and imaging studies  I/O last 3 completed shifts: In: -  Out: 2750 [Urine:2750] Total I/O In: -  Out: 1140 [Urine:1140] Lab Results  Component Value Date   CREATININE 0.82 06/06/2020   CREATININE 0.60 (L) 06/04/2020   CREATININE 0.61 06/03/2020   CBC: Recent Labs  Lab 05/30/20 1755 06/01/20 0649 06/02/20 0333 06/03/20 0748 06/04/20 0418  WBC 7.7 6.8 7.1 10.2 10.0  NEUTROABS  --  4.6 4.9 7.3 6.8  HGB 16.8 16.7 16.5 17.1* 17.3*  HCT 47.9 47.1 45.4 45.6 46.6  MCV 95.8 95.3 91.5 89.8 91.6  PLT 222 337 349 286 672   Basic Metabolic Panel: Recent Labs  Lab 06/01/20 0649 06/02/20 0333 06/03/20 0748 06/04/20 0418 06/06/20 0959  NA 135 133* 133* 134* 132*  K 4.8 4.9 4.7 3.9 3.5  CL 102 101 100 100 95*  CO2 14* 17* 18* 23 28  GLUCOSE 398* 441* 343* 267* 275*  BUN 24* 32* 34* 29* 17  CREATININE 0.78 0.87 0.61 0.60* 0.82  CALCIUM 8.3* 8.3* 8.2* 8.4* 8.4*  MG 3.0* 2.8* 2.9* 2.7* 2.3  PHOS 3.6 4.3 4.3 3.5 4.3   GFR: Estimated Creatinine Clearance: 115.9 mL/min (by C-G formula based on SCr of 0.82 mg/dL). Liver Function Tests: Recent Labs  Lab 06/01/20 0649 06/02/20 0333  06/03/20 0748 06/04/20 0418 06/06/20 0959  AST 49* 65* 38 41 74*  ALT 44 61* 49* 53* 65*  ALKPHOS 117 117 106 98 79  BILITOT 1.3* 1.6* 1.2 1.1 0.8  PROT 7.4 7.5 7.2 6.8 6.5  ALBUMIN 3.2* 3.1* 3.2* 3.1* 3.0*   CBG: Recent Labs  Lab 06/05/20 1209 06/05/20 1651 06/05/20 2005 06/06/20 0809 06/06/20 1157  GLUCAP 286* 399* 317* 122* 243*   Anemia Panel: Recent Labs    06/04/20 0418 06/06/20 0959  FERRITIN 1,896* 2,060*   Sepsis Labs: Recent Labs  Lab 05/30/20 1755  PROCALCITON 0.30  LATICACIDVEN 2.4*    Recent Results (from the past 240 hour(s))  Culture, blood (Routine X 2) w Reflex to ID Panel     Status: None   Collection Time: 05/30/20  5:55 PM   Specimen: BLOOD  Result Value Ref Range Status   Specimen Description BLOOD LEFT ANTECUBITAL  Final   Special Requests   Final    BOTTLES DRAWN AEROBIC AND ANAEROBIC Blood Culture adequate volume   Culture   Final    NO GROWTH 5 DAYS Performed at [facility], [address] [place], [place] [address]    Report Status 06/04/2020 FINAL  Final  Respiratory Panel by RT PCR (Flu A&B, Covid) - Nasopharyngeal Swab     Status: Abnormal   Collection Time: 05/30/20  5:55 PM   Specimen: Nasopharyngeal Swab  Result Value Ref Range Status   SARS Coronavirus 2 by RT PCR POSITIVE (A) NEGATIVE Final    Comment: RESULT CALLED TO, READ BACK BY AND VERIFIED WITH: [facility] 05/30/20 AT 1922 BY ACR (NOTE) SARS-CoV-2 target nucleic acids are DETECTED.  SARS-CoV-2 RNA is generally detectable in upper respiratory specimens  during the acute phase of infection. Positive results are indicative of the presence of the identified virus, but do not rule out bacterial infection or co-infection with other pathogens not  detected by the test. Clinical correlation with patient history and other diagnostic information is necessary to determine patient infection status. The expected result is Negative.  Fact Sheet for Patients:   PinkCheek.be  Fact Sheet for Healthcare Providers: GravelBags.it  This test is not yet approved or cleared by the [place] FDA and  has been authorized for detection and/or diagnosis of SARS-CoV-2 by FDA under an Emergency Use Authorization (EUA).  This EUA will remain in effect (meaning this test can  be used) for the duration of  the COVID-19 declaration under Section 564(b)(1) of the Act, 21 U.S.C. section 360bbb-3(b)(1), unless the authorization is terminated or revoked sooner.      Influenza A by PCR NEGATIVE NEGATIVE Final   Influenza B by PCR NEGATIVE NEGATIVE Final    Comment: (NOTE) The Xpert Xpress SARS-CoV-2/FLU/RSV assay is intended as an aid in  the diagnosis of influenza from Nasopharyngeal swab specimens and  should not be used as a sole basis for treatment. Nasal washings and  aspirates are unacceptable for Xpert Xpress SARS-CoV-2/FLU/RSV  testing.  Fact Sheet for Patients: PinkCheek.be  Fact Sheet for Healthcare Providers: GravelBags.it  This test is not yet approved or cleared by the [place] FDA and  has been authorized for detection and/or diagnosis of SARS-CoV-2 by  FDA under an Emergency Use Authorization (EUA). This EUA will remain  in effect (meaning this test can be used) for the duration of the  Covid-19 declaration under Section 564(b)(1) of the Act, 21  U.S.C. section 360bbb-3(b)(1), unless the authorization is  terminated or revoked. Performed at [facility], [address] [place], [place] [address]   Culture, blood (routine x 2)     Status: None   Collection Time: 05/30/20  6:20 PM   Specimen: BLOOD  Result Value Ref Range Status   Specimen Description BLOOD RIGTH [facility]  Final   Special Requests   Final    BOTTLES DRAWN AEROBIC AND ANAEROBIC Blood Culture adequate volume   Culture   Final    NO GROWTH 5  DAYS Performed at [facility], [address], [place], [place] [address]    Report Status 06/04/2020 FINAL  Final  C Difficile Quick Screen w PCR reflex     Status: None   Collection Time: 06/02/20 10:30 AM   Specimen: STOOL  Result Value Ref Range Status   C Diff antigen NEGATIVE NEGATIVE Final   C Diff toxin NEGATIVE NEGATIVE Final   C Diff interpretation No C. difficile detected.  Final    Comment: Performed at [facility], [address] [place], [place] [address]     Scheduled Meds: . vitamin C  500 mg Oral Daily  . baricitinib  4 mg Oral Daily  . enoxaparin (LOVENOX) injection  40 mg Subcutaneous Q24H  . feeding supplement (NEPRO CARB STEADY)  237 mL Oral TID WC  . insulin aspart  0-15 Units Subcutaneous TID WC  . insulin aspart  0-5 Units Subcutaneous QHS  . insulin aspart  3 Units Subcutaneous TID WC  . insulin glargine  25 Units Subcutaneous BID  . insulin starter kit- syringes  1 kit Other Once  . Ipratropium-Albuterol  1 puff Inhalation Q6H  . multivitamin with minerals  1 tablet Oral Daily  . predniSONE  50 mg Oral Daily  . sodium chloride flush  3 mL Intravenous Q12H  . zinc sulfate  220 mg Oral Daily    LOS: 7 days   Tineshia Becraft V  Posey Pronto, MD [facility] . Pager [phone] If 7PM-7AM, please contact night-coverage [email or web address] Password [facility] 06/06/2020, 3:53 PM

## 2020-06-06 NOTE — Progress Notes (Addendum)
Inpatient Diabetes Program Recommendations  AACE/ADA: New Consensus Statement on Inpatient Glycemic Control   Target Ranges:  Prepandial:   less than 140 mg/dL      Peak postprandial:   less than 180 mg/dL (1-2 hours)      Critically ill patients:  140 - 180 mg/dL  Results for Hefner, [place] (MRN [id number]) as of 06/06/2020 08:16  Ref. Range 06/06/2020 08:09  Glucose-Capillary Latest Ref Range: 70 - 99 mg/dL 322 (H)   Results for Hopman, [place] (MRN [id number]) as of 06/06/2020 07:34  Ref. Range 06/05/2020 08:22 06/05/2020 12:09 06/05/2020 16:51 06/05/2020 20:05  Glucose-Capillary Latest Ref Range: 70 - 99 mg/dL 376 (H)  Novolog 8 units  Lantus 25 units 286 (H)  Novolog 11 units 399 (H)  Novolog 18 units 317 (H)  Novolog 4 units  Lantus 25 units   Results for Graver, [place] (MRN [id number]) as of 06/06/2020 07:34  Ref. Range 06/02/2020 03:33  Hemoglobin A1C Latest Ref Range: 4.8 - 5.6 % 10.7 (H)   Review of Glycemic Control  Diabetes history: No Outpatient Diabetes medications: NA Current orders for Inpatient glycemic control: Lantus 25 units BID, Novolog 3 units TID with meals, Novolog 0-15 units TID with meals, Novolog 0-5 units QHS; Prednisone 50 mg daily  Inpatient Diabetes Program Recommendations:    Insulin: If steroids are continued as ordered, please consider increasing meal coverage to Novolog 10 units TID with meals.  Addendum 06/06/20@14 :48-Spoke with patient over the phone with [facility] Interpreter [phone]) about new diabetes diagnosis. Patient reports that he has never been told he had DM or preDM in the past. Patient does not have a PCP or insurance.   Discussed A1C results (10.7% on 06/02/20) and explained what an A1C is and informed patient that his current A1C indicates an average glucose of 260 mg/dl over the past 2-3 months. Discussed basic pathophysiology of DM Type 2, basic home care, importance of checking CBGs and maintaining good CBG control to prevent long-term  and short-term complications. Reviewed glucose and A1C goals.  Reviewed signs and symptoms of hyperglycemia and hypoglycemia along with treatment for both. Discussed impact of nutrition, exercise, stress, sickness, and medications on diabetes control. Discussed Carb Modified diet and informed pateint that RD has been consulted to provide more education on Carb Modified diet.  Informed patient that while inpatient, he has been receiving steroids which are contributing to hyperglycemia. Patient notes that he has the Living Well with diabetes booklet and patient has started looking through the book already over the past few days. Encouraged patient to read through entire book.  Informed patient that it would be requested that TOC provide a glucometer and testing supplies to him as well as assist with arranging follow up and medication assistance.  Asked patient to check his glucose as directed at time of discharge and to keep a log book of glucose readings or take his glucometer to his follow up appointments. Explained how the doctor he follows up with can use the log book or glucometer to continue to make adjustments with DM medications if needed. Explained that at this time, provider has not indicated if he will be discharged on oral DM medication or insulin but if insulin is prescribed he will need to be educated on insulin by bedside nursing staff. Will ask RNs to educate on glucose monitoring and insulin administration.   Patient verbalized understanding of information discussed and he states that he has no further questions at this time related to diabetes.  RNs to provide ongoing basic DM education at bedside with this patient and engage patient to actively check blood glucose and administer insulin injections. Reconsulted RD for diet education as patient is now appropriate for education.    Thanks, Orlando Penner, RN, MSN, CDE Diabetes Coordinator Inpatient Diabetes Program [phone] (Team Pager from 8am  to 5pm)

## 2020-06-07 DIAGNOSIS — J9600 Acute respiratory failure, unspecified whether with hypoxia or hypercapnia: Secondary | ICD-10-CM | POA: Diagnosis not present

## 2020-06-07 DIAGNOSIS — U071 COVID-19: Secondary | ICD-10-CM | POA: Diagnosis not present

## 2020-06-07 DIAGNOSIS — J1282 Pneumonia due to coronavirus disease 2019: Secondary | ICD-10-CM | POA: Diagnosis not present

## 2020-06-07 LAB — GLUCOSE, CAPILLARY
Glucose-Capillary: 152 mg/dL — ABNORMAL HIGH (ref 70–99)
Glucose-Capillary: 224 mg/dL — ABNORMAL HIGH (ref 70–99)

## 2020-06-07 LAB — FERRITIN: Ferritin: 1837 ng/mL — ABNORMAL HIGH (ref 24–336)

## 2020-06-07 LAB — C-REACTIVE PROTEIN: CRP: 1 mg/dL — ABNORMAL HIGH (ref <1.0)

## 2020-06-07 LAB — LACTATE DEHYDROGENASE: LDH: 201 U/L — ABNORMAL HIGH (ref 98–192)

## 2020-06-07 MED ORDER — ASCORBIC ACID 500 MG PO TABS: 500.0000 mg | ORAL_TABLET | Freq: Two times a day (BID) | ORAL | 0 refills | Status: AC

## 2020-06-07 MED ORDER — INSULIN STARTER KIT- SYRINGES (ENGLISH)
1.0000 | Freq: Once | Status: AC
  Administered 2020-06-07: 1
  Filled 2020-06-07: qty 1

## 2020-06-07 MED ORDER — METFORMIN HCL 850 MG PO TABS: 850.0000 mg | ORAL_TABLET | Freq: Two times a day (BID) | ORAL | 0 refills | Status: DC

## 2020-06-07 MED ORDER — GLIPIZIDE 5 MG PO TABS: 2.5000 mg | ORAL_TABLET | Freq: Two times a day (BID) | ORAL | 0 refills | Status: AC

## 2020-06-07 MED ORDER — IPRATROPIUM-ALBUTEROL 20-100 MCG/ACT IN AERS: 1.0000 | INHALATION_SPRAY | Freq: Four times a day (QID) | RESPIRATORY_TRACT | 0 refills | Status: AC

## 2020-06-07 MED ORDER — ZINC SULFATE 220 (50 ZN) MG PO CAPS: 220.0000 mg | ORAL_CAPSULE | Freq: Every day | ORAL | 0 refills | Status: AC

## 2020-06-07 NOTE — Progress Notes (Signed)
Patient ambulated around room for 6+ minutes on room air. O2 saturation remained 90% or greater the entire time. Patient was not in any distress.

## 2020-06-07 NOTE — Discharge Summary (Signed)
Physician Discharge Summary  Steven Luna BOF:[id number] DOB: 05-18-1971 DOA: 05/30/2020  PCP: Sharlet Salina, MD  Admit date: 05/30/2020 Discharge date: 06/07/2020   Discharge Diagnoses:  Active Problems:   Chronic pain   Chronic low back pain (Location of Primary Source of Pain) (Bilateral) (L>R)   Pneumonia due to COVID-19 virus   Severe sepsis (HCC)   Acute respiratory failure with hypoxia (HCC)   Acute respiratory failure due to COVID-19 (HCC)  Severe sepsis and acute hypoxic respiratory failure secondary to COVID-19 pneumonia: Patient met severe sepsis criteria based on fever, tachycardia tachypnea and hypoxia and lactic acid of 2.4. This is due to COVID-19 pneumonia. Patient continues to state that he feels better. Patient continues to state that he feels betterand oxygen demand is lower today and currently he is on about35L high flow nasal cannula oxygen with 50% FiO2. He completed remdesivir yesterday. We will continue following: Baricitinib IV Solu-Medrol, antitussive, bronchodilator, zinc, vitamin C.  Patient was encouraged to prone, out of bed to chair, to use incentive spirometry and flutter valve. 9/22 Sepsis is resolved. Continue with anti-covid regimen treatment.  D/w pt about proning.  COVID-19 Labs  Recent Labs (last 2 labs)       Recent Labs    06/04/20 0418 06/06/20 0959  FERRITIN 1,896* 2,060*  LDH  --  222*  CRP 2.5* 1.5*      Recent Labs       Lab Results  Component Value Date   SARSCOV2NAA POSITIVE (A) 05/30/2020    SpO2: 96 % O2 Flow Rate (L/min): 7 L/min FiO2 (%): 45 %  9/23 COVID-19 Labs  Recent Labs (last 2 labs)       Recent Labs    06/04/20 0418 06/06/20 0959  FERRITIN 1,896* 2,060*  LDH  --  222*  CRP 2.5* 1.5*      Recent Labs       Lab Results  Component Value Date   SARSCOV2NAA POSITIVE (A) 05/30/2020    SpO2: 96 % O2 Flow Rate (L/min): 7 L/min FiO2 (%): 45 %  9/24 Patient sepsis has  resolved patient no longer meets sepsis criteria. Status post treatment for Covid-19 pna. 6-minute walk test is negative.  Patient no longer needs supplemental oxygen. Discussed with patient about plan for proning at home and instructions on protection and isolation. SpO2: 93 % O2 Flow Rate (L/min): 1 L/min FiO2 (%): 45 %   Diarrhea: C. difficile ruled out. Diarrhea improved. - attribute to Covid-19 enteritis whish has resolved.    Newly diagnosed uncontrolled type 2 diabetes mellitus with hyperglycemia:  Hemoglobin A1c 10.3. New diagnosis of type 2 diabetes mellitus. Still hyperglycemic with blood sugar in 300 range. Will double the dose of Lantus to 25 units twice daily and continue premeal and SSI. Discharge plan is to transition patient to Metformin eight fifty twice a day along with low-dose glipizide once a day.  [facility]  Chronic pain syndrome: Takes Percocet at home. Continue that. No change in t/t plan.    Discharge Condition:  Stable  Diet recommendation: Consistent carb cardiac diet.  Filed Weights   06/05/20 0500 06/06/20 0300 06/07/20 0421  Weight: 84.6 kg 83.3 kg 84.6 kg    Discharge activity: No restrictions and as tolerated.  History of present illness:  Steven Luna a 49 y.o.malewith medical history significant ofchronic pain on opioids presented to ED with a complaint of shortness of breath, body aches, fever since little over  7 days. According to patient, he was tested positive for Covid about a week ago at mobile unit. Patient continued to have progressive worsening of the symptoms with some nonproductive cough, loss of appetite and some mild intermittent chest pain so he came to ED.  Upon arrival to ED, he was febrile with temperature of 101.9, tachycardia and tachypneic and hypoxic 88% on room air requiring 2 L of oxygen. CBC and BMP unremarkable. 2 sets of troponin negative.  He was tested positive for COVID-19 again with elevated noncommittal markers.Hospital service were consulted to admit the patient for further management. He was started on IV Solu-Medrol and remdesivir. Initially he was requiring 4 L of oxygen but overnight he required almost 10 to 12 L of high flow oxygen. He was then started on baricitinib next morning.  Hospital Course:  Patient admitted with Covid pneumonia and respiratory failure. Hospital stay has been stable patient has received antiviral regimen with remdesivir and steroid therapy, vitamin C and zinc.  She has been cooperative with proning and incentive spirometer.   Consultations: Respiratory Therapy.  Discharge Exam: Vitals:   06/07/20 1159 06/07/20 1200  BP: 121/81   Pulse: 98 98  Resp: 18 17  Temp: 98 F (36.7 C)   SpO2: 95% 93%   Physical Exam Vitals and nursing note reviewed.  Constitutional:      Appearance: He is normal weight.  HENT:     Head: Normocephalic and atraumatic.  Eyes:     Pupils: Pupils are equal, round, and reactive to light.  Cardiovascular:     Rate and Rhythm: Normal rate and regular rhythm.     Pulses: Normal pulses.  Pulmonary:     Effort: Pulmonary effort is normal.     Breath sounds: Normal breath sounds.  Abdominal:     General: Bowel sounds are normal.     Palpations: Abdomen is soft.  Musculoskeletal:        General: Normal range of motion.     Cervical back: Normal range of motion and neck supple.  Skin:    General: Skin is warm.  Neurological:     General: No focal deficit present.     Mental Status: He is alert and oriented to person, place, and time.  Psychiatric:        Mood and Affect: Mood normal.        Behavior: Behavior normal.    Discharge Instructions Discharge Instructions    Activity as tolerated - No restrictions   Complete by: As directed    Call MD for:   Complete by: As directed    Call MD for:  difficulty breathing, headache or visual disturbances    Complete by: As directed    Call MD for:  extreme fatigue   Complete by: As directed    Call MD for:  hives   Complete by: As directed    Call MD for:  persistant dizziness or light-headedness   Complete by: As directed    Call MD for:  persistant nausea and vomiting   Complete by: As directed    Call MD for:  redness, tenderness, or signs of infection (pain, swelling, redness, odor or green/yellow discharge around incision site)   Complete by: As directed    Call MD for:  severe uncontrolled pain   Complete by: As directed    Call MD for:  temperature >100.4   Complete by: As directed    Diet - low sodium heart healthy   Complete  by: As directed    Increase activity slowly   Complete by: As directed      Allergies as of 06/07/2020   No Known Allergies     Medication List    STOP taking these medications   oxyCODONE-acetaminophen 5-325 MG tablet Commonly known as: PERCOCET/ROXICET     TAKE these medications   ascorbic acid 500 MG tablet Commonly known as: VITAMIN C Take 1 tablet (500 mg total) by mouth 2 (two) times daily.   glipiZIDE 5 MG tablet Commonly known as: GLUCOTROL Take 0.5 tablets (2.5 mg total) by mouth 2 (two) times daily before a meal.   Ipratropium-Albuterol 20-100 MCG/ACT Aers respimat Commonly known as: COMBIVENT Inhale 1 puff into the lungs every 6 (six) hours.   metFORMIN 850 MG tablet Commonly known as: Glucophage Take 1 tablet (850 mg total) by mouth 2 (two) times daily with a meal.   zinc sulfate 220 (50 Zn) MG capsule Take 1 capsule (220 mg total) by mouth daily. Start taking on: June 08, 2020      No Known Allergies    The results of significant diagnostics from this hospitalization (including imaging, microbiology, ancillary and laboratory) are listed below for reference.    Significant Diagnostic Studies: DG Chest 2 View  Result Date: 05/30/2020 CLINICAL DATA:  COVID EXAM: CHEST - 2 VIEW COMPARISON:  None. FINDINGS: The  cardiomediastinal silhouette is mildly enlarged in contour. No pleural effusion. No pneumothorax. Multifocal bilateral peripheral predominant heterogeneous opacities, consistent with the sequela of COVID-19 infection. Visualized abdomen is unremarkable. No acute osseous abnormality noted. IMPRESSION: Multifocal bilateral peripheral predominant heterogeneous opacities, consistent with the sequela of COVID-19 infection. Recommend follow-up radiograph in 3 months to assess for resolution. Electronically Signed   By: Valentino Saxon MD   On: 05/30/2020 13:22   DG Chest Port 1 View  Result Date: 06/02/2020 CLINICAL DATA:  COVID EXAM: PORTABLE CHEST 1 VIEW COMPARISON:  May 30, 2020 FINDINGS: The cardiomediastinal silhouette is unchanged in contour.Low lung volumes. No pleural effusion. No pneumothorax. Diffuse bilateral peripheral predominant heterogeneous opacities, consistent with the sequela of COVID-19 infection. These are increased in conspicuity in comparison to prior, likely due to differences in technique. Visualized abdomen is unremarkable. No acute osseous abnormality. IMPRESSION: Diffuse bilateral peripheral predominant heterogeneous opacities, consistent with the sequela of COVID-19 infection. These are increased in conspicuity, most likely due to differences in technique. Electronically Signed   By: Valentino Saxon MD   On: 06/02/2020 09:08    Microbiology: Recent Results (from the past 240 hour(s))  Culture, blood (Routine X 2) w Reflex to ID Panel     Status: None   Collection Time: 05/30/20  5:55 PM   Specimen: BLOOD  Result Value Ref Range Status   Specimen Description BLOOD LEFT ANTECUBITAL  Final   Special Requests   Final    BOTTLES DRAWN AEROBIC AND ANAEROBIC Blood Culture adequate volume   Culture   Final    NO GROWTH 5 DAYS Performed at [facility], [address] [place], [place] [address]    Report Status 06/04/2020 FINAL  Final  Respiratory Panel by RT  PCR (Flu A&B, Covid) - Nasopharyngeal Swab     Status: Abnormal   Collection Time: 05/30/20  5:55 PM   Specimen: Nasopharyngeal Swab  Result Value Ref Range Status   SARS Coronavirus 2 by RT PCR POSITIVE (A) NEGATIVE Final    Comment: RESULT CALLED TO, READ BACK BY AND VERIFIED WITH: [facility] 05/30/20 AT  1922 BY ACR (NOTE) SARS-CoV-2 target nucleic acids are DETECTED.  SARS-CoV-2 RNA is generally detectable in upper respiratory specimens  during the acute phase of infection. Positive results are indicative of the presence of the identified virus, but do not rule out bacterial infection or co-infection with other pathogens not detected by the test. Clinical correlation with patient history and other diagnostic information is necessary to determine patient infection status. The expected result is Negative.  Fact Sheet for Patients:  PinkCheek.be  Fact Sheet for Healthcare Providers: GravelBags.it  This test is not yet approved or cleared by the [place] FDA and  has been authorized for detection and/or diagnosis of SARS-CoV-2 by FDA under an Emergency Use Authorization (EUA).  This EUA will remain in effect (meaning this test can  be used) for the duration of  the COVID-19 declaration under Section 564(b)(1) of the Act, 21 U.S.C. section 360bbb-3(b)(1), unless the authorization is terminated or revoked sooner.      Influenza A by PCR NEGATIVE NEGATIVE Final   Influenza B by PCR NEGATIVE NEGATIVE Final    Comment: (NOTE) The Xpert Xpress SARS-CoV-2/FLU/RSV assay is intended as an aid in  the diagnosis of influenza from Nasopharyngeal swab specimens and  should not be used as a sole basis for treatment. Nasal washings and  aspirates are unacceptable for Xpert Xpress SARS-CoV-2/FLU/RSV  testing.  Fact Sheet for Patients: PinkCheek.be  Fact Sheet for Healthcare  Providers: GravelBags.it  This test is not yet approved or cleared by the [place] FDA and  has been authorized for detection and/or diagnosis of SARS-CoV-2 by  FDA under an Emergency Use Authorization (EUA). This EUA will remain  in effect (meaning this test can be used) for the duration of the  Covid-19 declaration under Section 564(b)(1) of the Act, 21  U.S.C. section 360bbb-3(b)(1), unless the authorization is  terminated or revoked. Performed at [facility], [address] [place], [place] [address]   Culture, blood (routine x 2)     Status: None   Collection Time: 05/30/20  6:20 PM   Specimen: BLOOD  Result Value Ref Range Status   Specimen Description BLOOD RIGTH [facility]  Final   Special Requests   Final    BOTTLES DRAWN AEROBIC AND ANAEROBIC Blood Culture adequate volume   Culture   Final    NO GROWTH 5 DAYS Performed at [facility], [address], [place], [place] [address]    Report Status 06/04/2020 FINAL  Final  C Difficile Quick Screen w PCR reflex     Status: None   Collection Time: 06/02/20 10:30 AM   Specimen: STOOL  Result Value Ref Range Status   C Diff antigen NEGATIVE NEGATIVE Final   C Diff toxin NEGATIVE NEGATIVE Final   C Diff interpretation No C. difficile detected.  Final    Comment: Performed at [facility], [address] [place], [place] [address]     Labs: Basic Metabolic Panel: Recent Labs  Lab 06/01/20 [phone] 06/02/20 0333 06/03/20 0748 06/04/20 0418 06/06/20 0959  NA 135 133* 133* 134* 132*  K 4.8 4.9 4.7 3.9 3.5  CL 102 101 100 100 95*  CO2 14* 17* 18* 23 28  GLUCOSE 398* 441* 343* 267* 275*  BUN 24* 32* 34* 29* 17  CREATININE 0.78 0.87 0.61 0.60* 0.82  CALCIUM 8.3* 8.3* 8.2* 8.4* 8.4*  MG 3.0* 2.8* 2.9* 2.7* 2.3  PHOS 3.6 4.3 4.3 3.5 4.3   Liver Function Tests: Recent Labs  Lab 06/01/20 0649 06/02/20 0333 06/03/20 0748 06/04/20 0418 06/06/20 0959  AST 49* 65*  38 41 74*  ALT 44 61* 49* 53* 65*  ALKPHOS 117 117 106 98 79  BILITOT 1.3* 1.6* 1.2 1.1 0.8  PROT 7.4 7.5 7.2 6.8 6.5  ALBUMIN 3.2* 3.1* 3.2* 3.1* 3.0*   No results for input(s): LIPASE, AMYLASE in the last 168 hours. No results for input(s): AMMONIA in the last 168 hours. CBC: Recent Labs  Lab 06/01/20 0649 06/02/20 0333 06/03/20 0748 06/04/20 0418  WBC 6.8 7.1 10.2 10.0  NEUTROABS 4.6 4.9 7.3 6.8  HGB 16.7 16.5 17.1* 17.3*  HCT 47.1 45.4 45.6 46.6  MCV 95.3 91.5 89.8 91.6  PLT 337 349 286 248    CBG: Recent Labs  Lab 06/06/20 1157 06/06/20 1638 06/06/20 1952 06/07/20 0817 06/07/20 1202  GLUCAP 243* 295* 323* 152* 224*    Time spent: 20 minutes  Signed:  Para Skeans MD.  [facility] 06/07/2020, 2:47 PM

## 2020-06-07 NOTE — Progress Notes (Addendum)
Inpatient Diabetes Program Recommendations  AACE/ADA: New Consensus Statement on Inpatient Glycemic Control (2015)  Target Ranges:  Prepandial:   less than 140 mg/dL      Peak postprandial:   less than 180 mg/dL (1-2 hours)      Critically ill patients:  140 - 180 mg/dL   Results for Steven Luna, Steven Luna (MRN [id number]) as of 06/07/2020 07:53  Ref. Range 06/06/2020 08:09 06/06/2020 11:57 06/06/2020 16:38 06/06/2020 19:52  Glucose-Capillary Latest Ref Range: 70 - 99 mg/dL 122 (H)  5 units NOVOLOG +  25 units LANTUS @9 :51am   243 (H)  8 units NOVOLOG  295 (H)  11 units NOVOLOG  323 (H)  4 units NOVOLOG +  25 units LANTUS @8 :19pm     Diabetes history: No  New Diagnosis of Diabetes made this admission  Current Insulin Orders: Lantus 25 units BID            Novolog 3 units TID with meals       Novolog 0-15 units TID + HS       Prednisone 50 mg daily    Diabetes Coordinator provided counseling to pt yesterday by phone using [employer].   MD- Note afternoon CBGs remain elevated likely due to the Prednisone  Please consider increasing the Novolog Meal Coverage to 6 units TID with meals  Have asked the bedside nursing staff to begin insulin teaching with pt in case decision made to d/c pt home on insulin given pt requiring large amount of insulin in the hospital setting    Addendum 1:30pm--Spoke with pt by phone (due to Airborne precautions due to [place]) using [employer] [phone].  Asked pt if he had any follow up questions from yesterday (DM Coordinator spoke w/ pt by phone yest about new diabetes diagnosis).  Pt stated he did not have any questions at this time.  Quickly re-reviewed why we are checking CBGs and giving him insulin.  Also re-reviewed CBG goals for home.  TOC team gave pt a free CBG meter kit today.  Pt told me he has been practicing giving insulin injections and verified this with the RN as well.  Discussed w/ pt that I am unsure at this time  what meds the MD will send him home on.  Pt stated he will take whatever we give him.     --Will follow patient during hospitalization--  Wyn Quaker RN, MSN, CDE Diabetes Coordinator Inpatient Glycemic Control Team Team Pager: [phone] (8a-5p)

## 2020-06-07 NOTE — Progress Notes (Signed)
Nutrition Education Note   RD consulted for nutrition education regarding diabetes.  49 y.o. male with medical history significant of chronic pain on opioids who is admitted with sepsis secondary to COVID 19 PNA and new DM   Lab Results  Component Value Date   HGBA1C 10.7 (H) 06/02/2020   Diet Education provided today via phone by interpreter [phone]  Pt reports improved appetite and oral intake in hospital; pt eating 100% of meals but reports that he is not drinking the supplements.   RD provided "Nutrition and Type II Diabetes" handout from the Academy of Nutrition and Dietetics via mail (spnish version). Discussed different food groups and their effects on blood sugar, emphasizing carbohydrate-containing foods. Provided list of carbohydrates and recommended serving sizes of common foods.  Discussed importance of controlled and consistent carbohydrate intake throughout the day. Provided examples of ways to balance meals/snacks and encouraged intake of high-fiber, whole grain complex carbohydrates. Teach back method used.  Expect good compliance.  RD following this pateint  Betsey Holiday MS, RD, LDN Please refer to [facility] for RD and/or RD on-call/weekend/after hours pager

## 2020-06-07 NOTE — Clinical Social Work Note (Signed)
Patient given a glucometer via [employer]. Pt is in COVID room. Referral made to [facility], they will follow up with pt.  [place], [place] [phone]

## 2020-06-07 NOTE — Progress Notes (Signed)
Patient ambulated to restroom on room air maintaining 02 saturation above 90, verbalizes no discomfort or distress while moving around. Patient remained on room air for remainder of shift. Encourage to prone while sleeping. Maintained 02 saturation above 90s while resting.

## 2020-06-07 NOTE — Progress Notes (Signed)
[name] to be D/C'd Home per MD order. Patient given discharge teaching and paperwork regarding medications, diet, follow-up appointments and activity. Patient understanding verbalized. No questions or complaints at this time. Skin condition as charted. IV and telemetry removed prior to leaving.  No further needs by Care Management/Social Work. Prescriptions e-prescribed to pharmacy.  An After Visit Summary was printed and given to the patient.   Patient escorted via wheelchair.  He declined using an interpreter for d/c instructions but was able to teach back in [place] and I reinforced with his daughter who is bilingual.   Steven Luna

## 2020-06-07 NOTE — Discharge Instructions (Signed)
Preguntas frecuentes sobre el COVID-19 COVID-19 Frequently Asked Questions El COVID-19 (enfermedad por coronavirus) es una infeccin causada por una gran familia de virus. Algunos virus causan enfermedades en las personas y otros causan enfermedades en animales tales como los camellos, los gatos y los murcilagos. En algunos casos, los virus que causan enfermedades en los animales pueden transmitirse a los seres humanos. De dnde provino el coronavirus? En diciembre de 2019, [place] le inform a la World Health Organization (Organizacin Mundial de la Salud, OMS) acerca de varios casos de enfermedad pulmonar (enfermedad respiratoria humana). Estos casos estaban vinculados con un mercado abierto de frutos de mar y ganado en la ciudad de [place]. El vnculo con el mercado de ganado y mariscos sugiere que el virus puede haberse propagado de los animales a los humanos. Sin embargo, desde ese primer brote en diciembre, tambin se ha demostrado que el virus se contagia de una persona a otra. Cul es el nombre de la enfermedad y del virus? Nombre de la enfermedad Al principio, esta enfermedad se llam nuevo coronavirus. Esto se debe a que los cientficos determinaron que la enfermedad era causada por un nuevo virus respiratorio. La World Health Organization (Organizacin Mundial de la Salud, OMS) ahora ha dado a la enfermedad el nombre de COVID-19, o enfermedad por coronavirus. Nombre del virus El virus causante de la enfermedad se conoce como coronavirus de tipo 2 causante del sndrome respiratorio agudo grave (SARS-CoV-2). Ms informacin sobre el nombre de la enfermedad y el virus [employer] Organization (Organizacin Mundial de la Salud) (OMS): www.who.int/emergencies/diseases/novel-coronavirus-2019/technical-guidance/naming-the-coronavirus-disease-(covid-2019)-and-the-virus-that-causes-it Quines estn en riesgo de sufrir complicaciones debido a la enfermedad por coronavirus? Algunas personas pueden  tener un riesgo ms alto de tener complicaciones debido a la enfermedad por coronavirus. Entre ellas se encuentran los adultos mayores y las personas que tienen enfermedades crnicas, como enfermedad cardaca, diabetes y enfermedad pulmonar. Si tiene un riesgo ms alto de tener complicaciones, tome estas precauciones adicionales:  Permanecer en su casa todo lo que sea posible.  Evitar las reuniones sociales y los viajes.  Evitar el contacto cercano con otras personas. Permanecer a una distancia de al menos 6 pies (2 m) de las otras personas, si es posible.  Lavarse las manos frecuentemente con agua y jabn durante al menos 20segundos.  Evitar tocarse la cara, la boca, la nariz y los ojos.  Tener a mano los suministros en su casa, como alimentos, medicamentos y productos de limpieza.  Si debe salir de su casa, use un barbijo de tela o una mascarilla facial. Asegrese de que le cubra la nariz y la boca. Cmo se transmite la enfermedad causada por el coronavirus? El virus que causa la enfermedad por coronavirus se transmite fcilmente de una persona a otra (es contagioso). Usted puede contagiarse con este virus:  Al inspirar las gotitas de una persona infectada. Las gotitas pueden diseminarse cuando una persona respira, habla, canta, tose o estornuda.  Al tocar algo, como una mesa o el picaportes de una puerta, que estuvo expuesto al virus (contaminado) y luego tocarse la boca, nariz o los ojos. Puedo contraer al virus al tocar superficies u objetos? Todava hay mucho que no se conoce acerca del virus que causa la enfermedad por coronavirus. Los cientficos basan gran parte de la informacin en lo que saben sobre virus similares, por ejemplo:  En general, los virus no sobreviven en superficies durante mucho tiempo. Necesitan un cuerpo humano (husped) para sobrevivir.  Es ms probable que el virus se contagie por contacto cercano con   personas que estn enfermas (contacto directo), por  ejemplo: ? Al estrechar las manos o abrazarse. ? Al inhalar las gotitas respiratorias que se desplazan por el aire. Las gotitas pueden diseminarse cuando una persona respira, habla, canta, tose o estornuda.  Es menos probable que el virus se propague cuando una persona toca una superficie o un objeto sobre el que est el virus (contacto indirecto). El virus puede ingresar al cuerpo si la persona toca una superficie o un objeto y luego se toca la cara, los ojos, la nariz o la boca. Una persona puede contagiar el virus sin tener sntomas de la enfermedad? Puede ser posible que el virus se contagie antes de que la persona tenga sntomas de la enfermedad, pero muy probablemente esta no sea la principal forma en que el virus se est propagando. Es ms probable que el virus se propague al estar en contacto estrecho con personas que estn enfermas e inhalar las gotas respiratorias que una persona disemina al respirar, hablar, cantar, toser o estornudar. Cules son los sntomas de la enfermedad causada por el coronavirus? Los sntomas varan de una persona a otra y pueden variar de leves a graves. Entre los sntomas, se pueden incluir los siguientes:  Fiebre o escalofros.  Tos.  Dificultad para respirar o falta de aire.  Dolores de cabeza, dolores en el cuerpo o dolores musculares.  Secrecin o congestin nasal.  El dolor de garganta.  Nueva prdida del sentido del gusto o del olfato.  Nuseas, vmitos o diarrea. Estos sntomas pueden aparecer en el trmino de 2 a 14 das despus de haber estado expuesto al virus. Algunas personas quizs no tengan sntomas. Si presenta sntomas, llame al mdico. Las personas con sntomas graves pueden necesitar atencin hospitalaria. Debo hacerme un anlisis de deteccin del virus? El mdico decidir si debe realizarse un anlisis en funcin de sus sntomas, antecedentes de exposicin y factores de riesgo. Cmo realiza el mdico el anlisis para detectar  este virus? Los mdicos obtienen muestras para enviar a analizar. Estas muestras pueden incluir lo siguiente:  Tomar con un hisopo una muestra de lquido de la parte posterior de la nariz y la garganta, la nariz o la garganta.  Pedirle que tosa mucosidad (esputo) para extraer lquido de los pulmones en un recipiente estril.  Tomar una muestra de sangre. Hay algn tratamiento o vacuna para este virus? Actualmente, no existe ninguna vacuna para prevenir la enfermedad por coronavirus. Adems, no existen medicamentos como los antibiticos o los antivirales para tratar el virus. Una persona que se enferma recibe tratamiento de apoyo, lo que significa reposo y lquidos. Una persona tambin puede aliviar sus sntomas con medicamentos de venta libre para tratar los estornudos, la tos y el goteo nasal. Son los mismos medicamentos que se toman para el resfro comn. Si presenta sntomas, llame al mdico. Las personas con sntomas graves pueden necesitar atencin hospitalaria. Qu puedo hacer para protegerme y proteger a mi familia de este virus?     Puede protegerse y proteger a su familia tomando las mismas medidas que tomara para prevenir el contagio de otros virus. Tome las siguientes medidas:  Lavarse las manos frecuentemente con agua y jabn durante al menos 20segundos. Usar desinfectante para manos con alcohol si no dispone de agua y jabn.  Evitar tocarse la cara, la boca, la nariz y los ojos.  Toser o estornudar en un pauelo descartable, sobre su manga o codo. No toser o estornudar al aire ni cubrirse con la mano. ? Si tose   o estornuda en un pauelo de papel, deschelo inmediatamente y lvese las manos.  Desinfectar los objetos y las superficies que se tocan con frecuencia todos los das.  Aljese de las personas enfermas.  Evite salir de su casa, siga las indicaciones de su estado y de las autoridades sanitarias locales.  Evite los espacios interiores repletos de gente. Permanezca  a una distancia de al menos 6 pies (2 m) de las otras personas.  Si debe salir de su casa, use un barbijo de tela o una mascarilla facial. Asegrese de que le cubra la nariz y la boca.  Quedarse en su casa si est enfermo, excepto para obtener atencin mdica. Llame al mdico antes de buscar atencin mdica. El mdico le indicar cunto tiempo debe quedarse en casa.  Asegrese de tener las vacunas al da. Pregntele al mdico qu vacunas necesita. Qu debo hacer si tengo que viajar? Siga las recomendaciones relacionadas con los viajes de la autoridad de salud local, los CDC y la OMS. Informacin y consejos para viajeros  Centers for Disease Control and Prevention (CDC) (Centros para el Control y la Prevencin de Enfermedades): www.cdc.gov/coronavirus/2019-ncov/travelers/index.html  Organizacin Mundial de la Salud (OMS): www.who.int/emergencies/diseases/novel-coronavirus-2019/travel-advice Conozca los riesgos y tome medidas para proteger su salud  El riesgo de contraer la enfermedad por coronavirus es ms alto si viaja a zonas con un brote o si est en contacto con viajeros que provienen de zonas donde hay un brote.  Lvese las manos con frecuencia y mantenga una higiene adecuada para reducir el riesgo de contagiarse o transmitir el virus. Qu debo hacer si estoy enfermo? Instrucciones generales para detener la propagacin de la infeccin  Lavarse las manos frecuentemente con agua y jabn durante al menos 20segundos. Usar desinfectante para manos con alcohol si no dispone de agua y jabn.  Toser o estornudar en un pauelo descartable, sobre su manga o codo. No toser o estornudar al aire ni cubrirse con la mano.  Si tose o estornuda en un pauelo de papel, deschelo inmediatamente y lvese las manos.  Qudese en su casa a menos que deba recibir atencin mdica. Llame al mdico o a la autoridad de salud local antes de buscar atencin mdica.  Evite las zonas pblicas. No viaje en  transporte pblico, de ser posible.  Si puede, use un barbijo si debe salir de la casa o si est en contacto cercano con alguien que no est enfermo. Asegrese de que le cubra la nariz y la boca. Mantenga su casa limpia  Desinfecte los objetos y las superficies que se tocan con frecuencia todos los das. Pueden incluir: ? Encimeras y mesas. ? Picaportes e interruptores de luz. ? Lavabos, fregaderos y grifos. ? Aparatos electrnicos tales como telfonos, controles remotos, teclados, computadoras y tabletas.  Lave los platos con agua jabonosa caliente o en el lavavajillas. Deje los platos para que se sequen al aire.  Lave la ropa con agua caliente. Evite infectar a otros miembros de la familia  Permita que los miembros de la familia sanos cuiden a los nios y las mascotas, si es posible. Si tiene que cuidar a los nios o las mascotas, lvese las manos con frecuencia y use un barbijo.  Duerma en una habitacin o cama diferentes, si es posible.  No comparta elementos personales, como afeitadoras, cepillos de dientes, desodorantes, peines, cepillos, toallas y toallitas de mano. Dnde buscar ms informacin Centers for Disease Control and Prevention (CDC)  Actualizaciones de informacin y novedades: www.cdc.gov/coronavirus/2019-ncov Organizacin Mundial de la Salud (OMS)    Actualizaciones de informacin y novedades: www.who.int/emergencies/diseases/novel-coronavirus-2019  Tema de salud relacionado con el coronavirus: www.who.int/health-topics/coronavirus  Preguntas y respuestas sobre COVID-19: www.who.int/news-room/q-a-detail/q-a-coronaviruses  Registro mundial: who.sprinklr.com American Academy of Pediatrics (AAP) (Academia Estadounidense de Pediatra)  Informacin para familias: www.healthychildren.org/English/health-issues/conditions/chest-lungs/Pages/2019-Novel-Coronavirus.aspx La situacin del coronavirus cambia rpidamente. Consulte el sitio web de su autoridad de salud local o  los sitios web de los CDC y la OMS para enterarse de las novedades y noticias. Cundo debo comunicarme con un mdico?  Comunquese con su mdico si tiene sntomas de infeccin, como fiebre o tos, y: ? Ha estado cerca de alguien que sabe que tiene la enfermedad por coronavirus. ? Ha estado en contacto con una persona que presuntamente sufra de la enfermedad por coronavirus. ? Ha viajado a una zona donde hay un brote de COVID-19. Cundo debo buscar asistencia mdica inmediata?  Busque ayuda de inmediato llamando al servicio de emergencias de su localidad (911 en los [place]) si tiene lo siguiente: ? Dificultad para respirar. ? Dolor u opresin en el pecho. ? Confusin. ? Labios y uas de color azulado. ? Dificultad para despertarse. ? Sntomas que empeoran. Informe al personal mdico de emergencias si cree que tiene la enfermedad por coronavirus. Resumen  Un nuevo virus respiratorio se propaga de una persona a otra y causa COVID-19 (enfermedad por coronavirus).  El virus que causa el COVID-19 parece diseminarse fcilmente. Se transmite de una persona a otra a travs de las gotitas que se despiden al respirar, hablar, cantar, toser o estornudar.  Los adultos mayores y las personas que tienen enfermedades crnicas tienen mayor riesgo de contraer la enfermedad. Si tiene un riesgo ms alto de tener complicaciones, tome precauciones adicionales.  Actualmente, no existe ninguna vacuna para prevenir la enfermedad por coronavirus. No existen medicamentos, como los antibiticos o los antivirales, para tratar el virus.  Puede protegerse y proteger a su familia al lavarse las manos con frecuencia, evitar tocarse la cara y cubrirse al toser y estornudar. Esta informacin no tiene como fin reemplazar el consejo del mdico. Asegrese de hacerle al mdico cualquier pregunta que tenga. Document Revised: 07/06/2019 Document Reviewed: 01/09/2019 Elsevier Patient Education  2020 [employer].  

## 2020-06-13 ENCOUNTER — Telehealth: Payer: Self-pay | Admitting: Gerontology

## 2020-06-13 NOTE — Telephone Encounter (Signed)
LVM @2 :20 on 06/13/20 to call back about becoming a new pt-KW

## 2022-03-27 IMAGING — DX DG CHEST 1V PORT
1 series · 1 of 1 positions shown · non-contrast
Comparison: May 30, 2020

CLINICAL DATA: COVID

EXAM:
PORTABLE CHEST 1 VIEW

[chest ap]
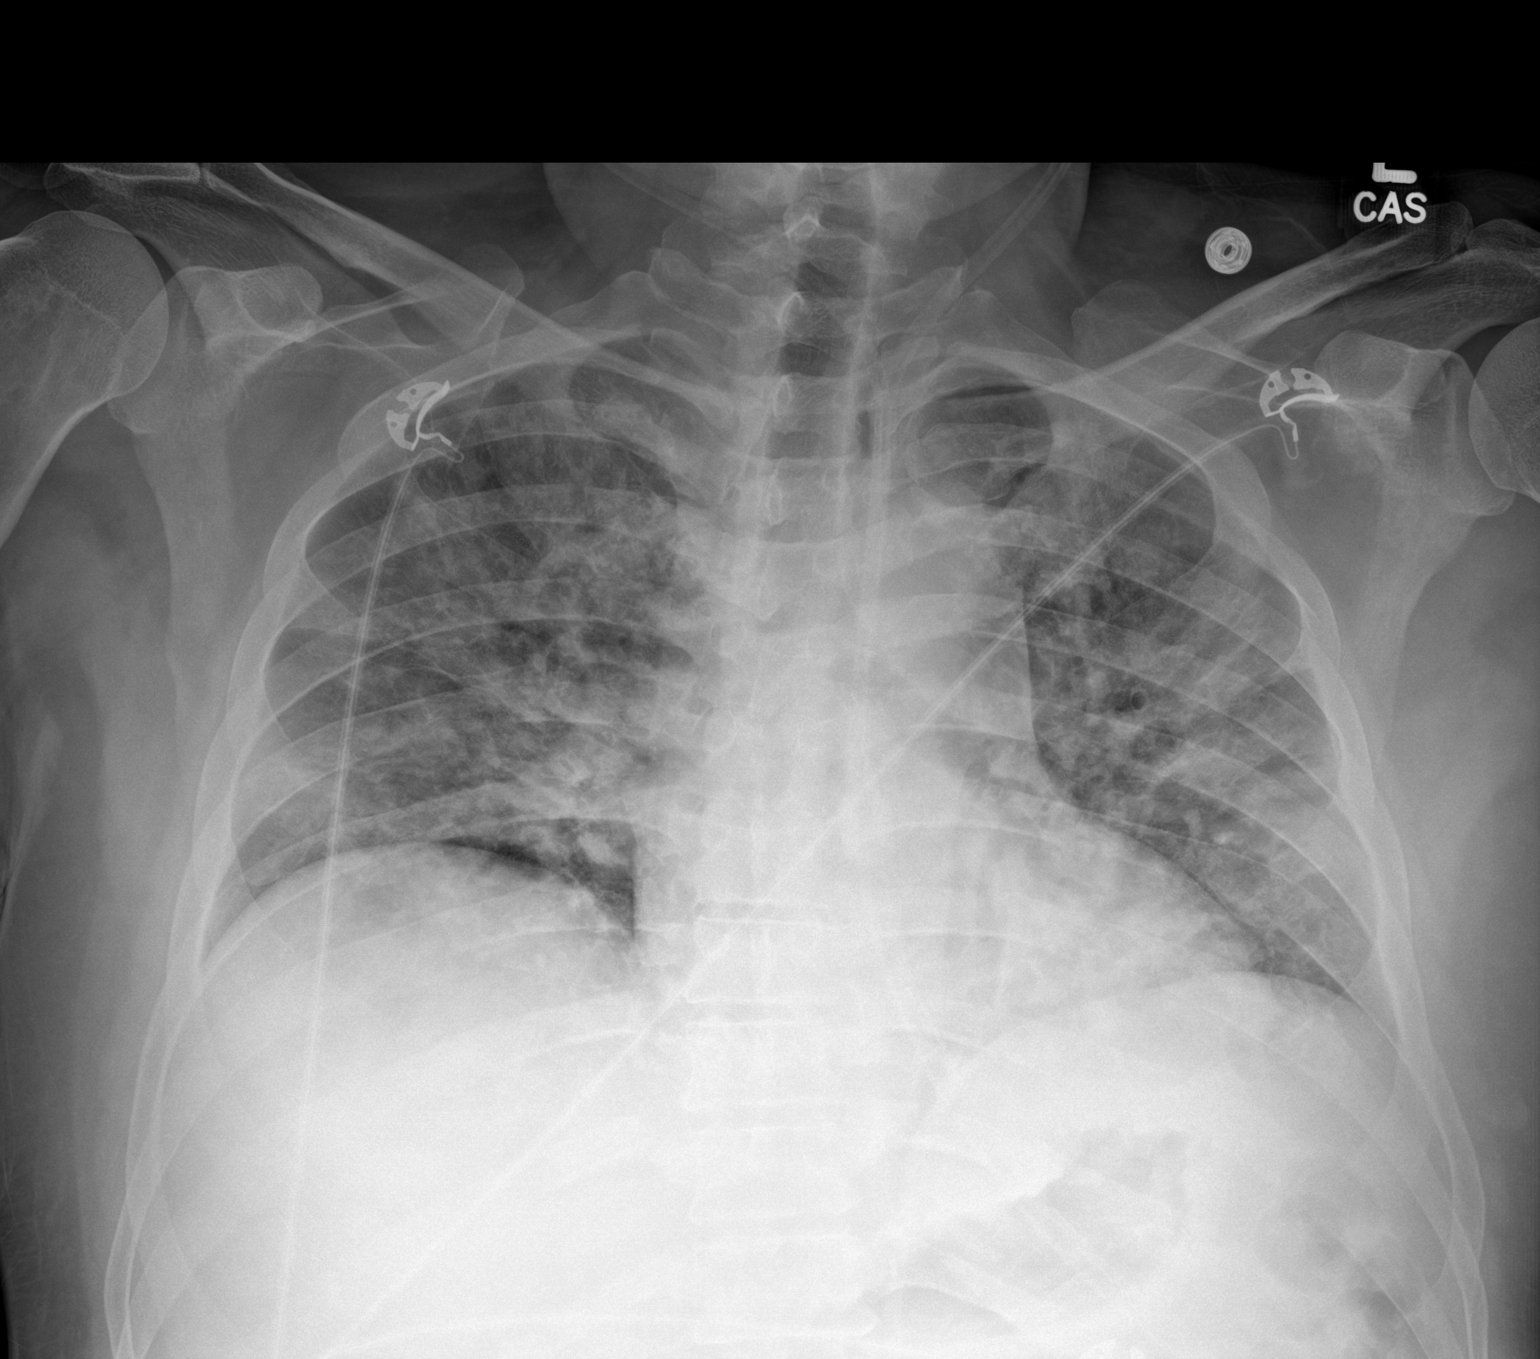

[1 of 1 positions shown; findings below may reference images not displayed]

FINDINGS: The cardiomediastinal silhouette is unchanged in contour.Low lung
volumes. No pleural effusion. No pneumothorax. Diffuse bilateral
peripheral predominant heterogeneous opacities, consistent with the
sequela of [id number] infection. These are increased in conspicuity in
comparison to prior, likely due to differences in technique.
Visualized abdomen is unremarkable. No acute osseous abnormality.
IMPRESSION: Diffuse bilateral peripheral predominant heterogeneous opacities,
consistent with the sequela of [id number] infection. These are
increased in conspicuity, most likely due to differences in
technique.

## 2023-05-15 ENCOUNTER — Emergency Department: Payer: Self-pay

## 2023-05-15 ENCOUNTER — Other Ambulatory Visit: Payer: Self-pay

## 2023-05-15 DIAGNOSIS — Z7984 Long term (current) use of oral hypoglycemic drugs: Secondary | ICD-10-CM | POA: Insufficient documentation

## 2023-05-15 DIAGNOSIS — E1165 Type 2 diabetes mellitus with hyperglycemia: Secondary | ICD-10-CM | POA: Insufficient documentation

## 2023-05-15 DIAGNOSIS — R42 Dizziness and giddiness: Secondary | ICD-10-CM | POA: Insufficient documentation

## 2023-05-15 LAB — COMPREHENSIVE METABOLIC PANEL
ALT: 36 U/L (ref 0–44)
AST: 43 U/L — ABNORMAL HIGH (ref 15–41)
Albumin: 4.3 g/dL (ref 3.5–5.0)
Alkaline Phosphatase: 88 U/L (ref 38–126)
Anion gap: 13 (ref 5–15)
BUN: 14 mg/dL (ref 6–20)
CO2: 26 mmol/L (ref 22–32)
Calcium: 9.7 mg/dL (ref 8.9–10.3)
Chloride: 94 mmol/L — ABNORMAL LOW (ref 98–111)
Creatinine, Ser: 0.96 mg/dL (ref 0.61–1.24)
GFR, Estimated: >60 mL/min (ref >60)
Glucose, Bld: 421 mg/dL — ABNORMAL HIGH (ref 70–99)
Potassium: 3.9 mmol/L (ref 3.5–5.1)
Sodium: 133 mmol/L — ABNORMAL LOW (ref 135–145)
Total Bilirubin: 0.9 mg/dL (ref 0.3–1.2)
Total Protein: 8.4 g/dL — ABNORMAL HIGH (ref 6.5–8.1)

## 2023-05-15 LAB — CBC
HCT: 46 % (ref 39.0–52.0)
Hemoglobin: 16.4 g/dL (ref 13.0–17.0)
MCH: 34.2 pg — ABNORMAL HIGH (ref 26.0–34.0)
MCHC: 35.7 g/dL (ref 30.0–36.0)
MCV: 95.8 fL (ref 80.0–100.0)
Platelets: 240 10*3/uL (ref 150–400)
RBC: 4.8 MIL/uL (ref 4.22–5.81)
RDW: 11.7 % (ref 11.5–15.5)
WBC: 9.4 10*3/uL (ref 4.0–10.5)
nRBC: 0 % (ref 0.0–0.2)

## 2023-05-15 LAB — TROPONIN I (HIGH SENSITIVITY): Troponin I (High Sensitivity): 8 ng/L (ref <18)

## 2023-05-15 LAB — LIPASE, BLOOD: Lipase: 143 U/L — ABNORMAL HIGH (ref 11–51)

## 2023-05-15 MED ORDER — ONDANSETRON 4 MG PO TBDP
4.0000 mg | ORAL_TABLET | Freq: Once | ORAL | Status: AC | PRN
  Administered 2023-05-15: 4 mg via ORAL
  Filled 2023-05-15: qty 1

## 2023-05-15 NOTE — ED Triage Notes (Signed)
Pt to ED via POV c/o vomiting and dizziness. Pt reports this started around 7am today. Denies any abd pain, chest pain, sob, fevers.

## 2023-05-16 ENCOUNTER — Emergency Department
Admission: EM | Admit: 2023-05-16 | Discharge: 2023-05-16 | Disposition: A | Discharge status: Home / Self Care | Payer: Self-pay | Attending: Emergency Medicine | Admitting: Emergency Medicine

## 2023-05-16 DIAGNOSIS — R739 Hyperglycemia, unspecified: Secondary | ICD-10-CM

## 2023-05-16 DIAGNOSIS — R42 Dizziness and giddiness: Secondary | ICD-10-CM

## 2023-05-16 LAB — CBG MONITORING, ED: Glucose-Capillary: 325 mg/dL — ABNORMAL HIGH (ref 70–99)

## 2023-05-16 LAB — URINALYSIS, ROUTINE W REFLEX MICROSCOPIC
Bilirubin Urine: NEGATIVE
Glucose, UA: >1000 mg/dL — AB
Ketones, ur: 15 mg/dL — AB
Leukocytes,Ua: NEGATIVE
Nitrite: NEGATIVE
Protein, ur: 100 mg/dL — AB
Specific Gravity, Urine: 1.015 (ref 1.005–1.030)
pH: 6 (ref 5.0–8.0)

## 2023-05-16 LAB — TROPONIN I (HIGH SENSITIVITY): Troponin I (High Sensitivity): 6 ng/L (ref <18)

## 2023-05-16 LAB — URINALYSIS, MICROSCOPIC (REFLEX)
Bacteria, UA: NONE SEEN
Squamous Epithelial / HPF: NONE SEEN /HPF (ref 0–5)

## 2023-05-16 MED ORDER — METFORMIN HCL 500 MG PO TABS
500.0000 mg | ORAL_TABLET | Freq: Once | ORAL | Status: AC
  Administered 2023-05-16: 500 mg via ORAL
  Filled 2023-05-16: qty 1

## 2023-05-16 MED ORDER — MECLIZINE HCL 25 MG PO TABS: 25.0000 mg | ORAL_TABLET | Freq: Three times a day (TID) | ORAL | 0 refills | Status: AC | PRN

## 2023-05-16 MED ORDER — MECLIZINE HCL 25 MG PO TABS
25.0000 mg | ORAL_TABLET | Freq: Once | ORAL | Status: AC
  Administered 2023-05-16: 25 mg via ORAL
  Filled 2023-05-16: qty 1

## 2023-05-16 MED ORDER — ONDANSETRON 4 MG PO TBDP
4.0000 mg | ORAL_TABLET | Freq: Once | ORAL | Status: AC
  Administered 2023-05-16: 4 mg via ORAL
  Filled 2023-05-16: qty 1

## 2023-05-16 MED ORDER — METFORMIN HCL 500 MG PO TABS: 500.0000 mg | ORAL_TABLET | Freq: Two times a day (BID) | ORAL | 1 refills | Status: AC

## 2023-05-16 MED ORDER — ONDANSETRON 4 MG PO TBDP: 4.0000 mg | ORAL_TABLET | Freq: Four times a day (QID) | ORAL | 0 refills | Status: AC | PRN

## 2023-05-16 NOTE — Discharge Instructions (Addendum)

## 2023-05-16 NOTE — ED Provider Notes (Signed)
[facility] Provider Note    Event Date/Time   First MD Initiated Contact with Patient 05/16/23 0150     (approximate)   History   Emesis and Dizziness   HPI  Steven Luna is a 52 y.o. male with history of diabetes not on medication who presents to the emergency department with complaints of vertigo.  Yesterday at 7am suddenly felt sweaty, dizzy.  Had nausea and vomiting. Felt like a Room spinning.  No lightheadedness. Laying down when started No HA or head injury. No chest pain or SOB. No numbness, tingling, weakness.  No hearing loss, ear pain, tinnitus. States he is able to ambulate.  He is not taking any meds despite being diagnosed with diabetes in 2021.  Was previously on metformin and glipizide.  States he has not taken this in years.  No PCP.  History provided by patient and wife using Spanish interpreter.    Past Medical History:  Diagnosis Date   Lumbago of lumbar region with sciatica 02/23/2017   Opiate use (22.5 MME/Day) 06/29/2016    History reviewed. No pertinent surgical history.  MEDICATIONS:  Prior to Admission medications   Medication Sig Start Date End Date Taking? Authorizing Provider  glipiZIDE (GLUCOTROL) 5 MG tablet Take 0.5 tablets (2.5 mg total) by mouth 2 (two) times daily before a meal. 06/07/20 08/06/20  Gertha Calkin, MD  Ipratropium-Albuterol (COMBIVENT) 20-100 MCG/ACT AERS respimat Inhale 1 puff into the lungs every 6 (six) hours. 06/07/20 07/07/20  Gertha Calkin, MD  metFORMIN (GLUCOPHAGE) 850 MG tablet Take 1 tablet (850 mg total) by mouth 2 (two) times daily with a meal. 06/07/20 07/07/20  Gertha Calkin, MD    Physical Exam   Triage Vital Signs: ED Triage Vitals  Encounter Vitals Group     BP 05/15/23 2100 (!) 181/92     Systolic BP Percentile --      Diastolic BP Percentile --      Pulse Rate 05/15/23 2100 85     Resp 05/15/23 2100 18     Temp 05/15/23 2100 97.8 F (36.6 C)     Temp Source  05/15/23 2100 Oral     SpO2 05/15/23 2100 97 %     Weight 05/15/23 2101 185 lb (83.9 kg)     Height --      Head Circumference --      Peak Flow --      Pain Score 05/15/23 2101 0     Pain Loc --      Pain Education --      Exclude from Growth Chart --     Most recent vital signs: Vitals:   05/16/23 0150 05/16/23 0230  BP: (!) 148/90 (!) 145/85  Pulse: 88 85  Resp:  12  Temp:    SpO2: 97% 98%    CONSTITUTIONAL: Alert, responds appropriately to questions. Well-appearing; well-nourished HEAD: Normocephalic, atraumatic EYES: Conjunctivae clear, pupils appear equal, sclera nonicteric ENT: normal nose; moist mucous membranes NECK: Supple, normal ROM CARD: RRR; S1 and S2 appreciated RESP: Normal chest excursion without splinting or tachypnea; breath sounds clear and equal bilaterally; no wheezes, no rhonchi, no rales, no hypoxia or respiratory distress, speaking full sentences ABD/GI: Non-distended; soft, non-tender, no rebound, no guarding, no peritoneal signs BACK: The back appears normal EXT: Normal ROM in all joints; no deformity noted, no edema SKIN: Normal color for age and race; warm; no rash on exposed skin NEURO: Moves all extremities equally, normal speech, no  facial asymmetry, normal sensation PSYCH: The patient's mood and manner are appropriate.   ED Results / Procedures / Treatments   LABS: (all labs ordered are listed, but only abnormal results are displayed) Labs Reviewed  LIPASE, BLOOD - Abnormal; Notable for the following components:      Result Value   Lipase 143 (*)    All other components within normal limits  COMPREHENSIVE METABOLIC PANEL - Abnormal; Notable for the following components:   Sodium 133 (*)    Chloride 94 (*)    Glucose, Bld 421 (*)    Total Protein 8.4 (*)    AST 43 (*)    All other components within normal limits  CBC - Abnormal; Notable for the following components:   MCH 34.2 (*)    All other components within normal limits   URINALYSIS, ROUTINE W REFLEX MICROSCOPIC - Abnormal; Notable for the following components:   Glucose, UA >1,000 (*)    Hgb urine dipstick TRACE (*)    Ketones, ur 15 (*)    Protein, ur 100 (*)    All other components within normal limits  CBG MONITORING, ED - Abnormal; Notable for the following components:   Glucose-Capillary 325 (*)    All other components within normal limits  URINALYSIS, MICROSCOPIC (REFLEX)  TROPONIN I (HIGH SENSITIVITY)  TROPONIN I (HIGH SENSITIVITY)     EKG:  EKG Interpretation Date/Time:  Saturday May 15 2023 21:02:39 EDT Ventricular Rate:  83 PR Interval:  136 QRS Duration:  84 QT Interval:  394 QTC Calculation: 462 R Axis:   -4  Text Interpretation: Normal sinus rhythm Minimal voltage criteria for LVH, may be normal variant ( R in aVL ) Possible Anterior infarct (cited on or before 30-May-2020) Abnormal ECG When compared with ECG of 30-May-2020 12:20, No significant change was found Confirmed by UNCONFIRMED, DOCTOR ([id number]), editor Fredric Mare, Tammy [phone]) on 05/15/2023 11:46:30 PM         RADIOLOGY: My personal review and interpretation of imaging: CT head unremarkable.  I have personally reviewed all radiology reports.   CT Head Wo Contrast  Result Date: 05/15/2023 CLINICAL DATA:  Syncope/presyncope, cerebrovascular cause suspected. Dizziness, vomiting. EXAM: CT HEAD WITHOUT CONTRAST TECHNIQUE: Contiguous axial images were obtained from the base of the skull through the vertex without intravenous contrast. RADIATION DOSE REDUCTION: This exam was performed according to the departmental dose-optimization program which includes automated exposure control, adjustment of the mA and/or kV according to patient size and/or use of iterative reconstruction technique. COMPARISON:  None Available. FINDINGS: Brain: Large cyst in the left posterior fossa overlying the left cerebellar hemisphere measuring up to 6.3 cm, likely arachnoid cyst. No acute infarction,  hemorrhage or hydrocephalus. No midline shift. Vascular: No hyperdense vessel or unexpected calcification. Skull: No acute calvarial abnormality. Sinuses/Orbits: No acute findings Other: None IMPRESSION: Left posterior fossa arachnoid cyst. No acute intracranial abnormality. Electronically Signed   By: Charlett Nose M.D.   On: 05/15/2023 23:27     PROCEDURES:  Critical Care performed: No   CRITICAL CARE Performed by: Baxter Hire Aleysha Meckler   Total critical care time: 0 minutes  Critical care time was exclusive of separately billable procedures and treating other patients.  Critical care was necessary to treat or prevent imminent or life-threatening deterioration.  Critical care was time spent personally by me on the following activities: development of treatment plan with patient and/or surrogate as well as nursing, discussions with consultants, evaluation of patient's response to treatment, examination of patient, obtaining history from  patient or surrogate, ordering and performing treatments and interventions, ordering and review of laboratory studies, ordering and review of radiographic studies, pulse oximetry and re-evaluation of patient's condition.   Marland Kitchen1-3 Lead EKG Interpretation  Performed by: Gavon Majano, Layla Maw, DO Authorized by: Jnaya Butrick, Layla Maw, DO     Interpretation: normal     ECG rate:  85   ECG rate assessment: normal     Rhythm: sinus rhythm     Ectopy: none     Conduction: normal       IMPRESSION / MDM / ASSESSMENT AND PLAN / ED COURSE  I reviewed the triage vital signs and the nursing notes.    Patient here with vertigo.  The patient is on the cardiac monitor to evaluate for evidence of arrhythmia and/or significant heart rate changes.   DIFFERENTIAL DIAGNOSIS (includes but not limited to):   Benign positional vertigo, Mnire's disease, low suspicion for stroke   Patient's presentation is most consistent with acute complicated illness / injury requiring diagnostic  workup.   PLAN: Labs from triage show hyperglycemia without DKA.  Normal hemoglobin.  Normal creatinine.  Lipase minimally elevated but no abdominal pain.  Troponin x 2 negative.  Urine shows no sign of infection.  CT head reviewed and interpreted by myself and the radiologist and shows no acute abnormality.  Patient is neurologically intact here.  Suspect benign positional vertigo.  Will start him back on metformin for his diabetes and have him follow-up with a PCP.  Will discharge with meclizine, Zofran.  He is tolerating p.o. and able to ambulate.  Patient comfortable with this plan.   MEDICATIONS GIVEN IN ED: Medications  ondansetron (ZOFRAN-ODT) disintegrating tablet 4 mg (4 mg Oral Given 05/15/23 2103)  metFORMIN (GLUCOPHAGE) tablet 500 mg (500 mg Oral Given 05/16/23 0234)  ondansetron (ZOFRAN-ODT) disintegrating tablet 4 mg (4 mg Oral Given 05/16/23 0234)  meclizine (ANTIVERT) tablet 25 mg (25 mg Oral Given 05/16/23 0234)     ED COURSE:  At this time, I do not feel there is any life-threatening condition present. I reviewed all nursing notes, vitals, pertinent previous records.  All lab and urine results, EKGs, imaging ordered have been independently reviewed and interpreted by myself.  I reviewed all available radiology reports from any imaging ordered this visit.  Based on my assessment, I feel the patient is safe to be discharged home without further emergent workup and can continue workup as an outpatient as needed. Discussed all findings, treatment plan as well as usual and customary return precautions.  They verbalize understanding and are comfortable with this plan.  Outpatient follow-up has been provided as needed.  All questions have been answered.    CONSULTS:  none   OUTSIDE RECORDS REVIEWED: Reviewed last admission in 2021.       FINAL CLINICAL IMPRESSION(S) / ED DIAGNOSES   Final diagnoses:  Vertigo  Hyperglycemia     Rx / DC Orders   ED Discharge Orders           Ordered    ondansetron (ZOFRAN-ODT) 4 MG disintegrating tablet  Every 6 hours PRN        05/16/23 0244    meclizine (ANTIVERT) 25 MG tablet  3 times daily PRN        05/16/23 0244    metFORMIN (GLUCOPHAGE) 500 MG tablet  2 times daily with meals        05/16/23 0244             Note:  This document was prepared using Dragon voice recognition software and may include unintentional dictation errors.   Sadiya Durand, Layla Maw, DO 05/16/23 0300
